# Patient Record
Sex: Male | Born: 1960 | Hispanic: Yes | Marital: Married | Smoking: Never smoker
Health system: Southern US, Community
[De-identification: ages and names within clinical notes are randomized; demographics above are authoritative.]

## PROBLEM LIST (undated history)

## (undated) DIAGNOSIS — E7800 Pure hypercholesterolemia, unspecified: Secondary | ICD-10-CM

## (undated) DIAGNOSIS — I10 Essential (primary) hypertension: Secondary | ICD-10-CM

## (undated) DIAGNOSIS — I499 Cardiac arrhythmia, unspecified: Secondary | ICD-10-CM

## (undated) DIAGNOSIS — R51 Headache: Secondary | ICD-10-CM

## (undated) DIAGNOSIS — R519 Headache, unspecified: Secondary | ICD-10-CM

---

## 2006-11-17 ENCOUNTER — Emergency Department: Payer: Self-pay | Admitting: Emergency Medicine

## 2006-11-26 ENCOUNTER — Emergency Department: Payer: Self-pay | Admitting: Emergency Medicine

## 2014-03-08 ENCOUNTER — Emergency Department: Payer: Self-pay | Admitting: Emergency Medicine

## 2014-03-08 LAB — CK TOTAL AND CKMB (NOT AT ARMC)
CK, TOTAL: 125 U/L
CK-MB: 0.8 ng/mL (ref 0.5–3.6)

## 2014-03-08 LAB — CBC
HCT: 43.1 % (ref 40.0–52.0)
HGB: 14.3 g/dL (ref 13.0–18.0)
MCH: 31.7 pg (ref 26.0–34.0)
MCHC: 33.2 g/dL (ref 32.0–36.0)
MCV: 95 fL (ref 80–100)
Platelet: 159 10*3/uL (ref 150–440)
RBC: 4.51 10*6/uL (ref 4.40–5.90)
RDW: 12.7 % (ref 11.5–14.5)
WBC: 6.8 10*3/uL (ref 3.8–10.6)

## 2014-03-08 LAB — TROPONIN I: Troponin-I: <0.02 ng/mL

## 2014-03-08 LAB — COMPREHENSIVE METABOLIC PANEL
ALK PHOS: 71 U/L
Albumin: 3.8 g/dL (ref 3.4–5.0)
Anion Gap: 10 (ref 7–16)
BUN: 12 mg/dL (ref 7–18)
Bilirubin,Total: 0.4 mg/dL (ref 0.2–1.0)
CALCIUM: 8 mg/dL — AB (ref 8.5–10.1)
CREATININE: 0.79 mg/dL (ref 0.60–1.30)
Chloride: 106 mmol/L (ref 98–107)
Co2: 25 mmol/L (ref 21–32)
EGFR (African American): >60
EGFR (Non-African Amer.): >60
GLUCOSE: 106 mg/dL — AB (ref 65–99)
OSMOLALITY: 281 (ref 275–301)
Potassium: 3.6 mmol/L (ref 3.5–5.1)
SGOT(AST): 24 U/L (ref 15–37)
SGPT (ALT): 24 U/L
Sodium: 141 mmol/L (ref 136–145)
Total Protein: 7.3 g/dL (ref 6.4–8.2)

## 2016-03-18 ENCOUNTER — Other Ambulatory Visit: Payer: Self-pay | Admitting: Pulmonary Disease

## 2016-03-18 DIAGNOSIS — M542 Cervicalgia: Secondary | ICD-10-CM

## 2016-03-18 DIAGNOSIS — G44329 Chronic post-traumatic headache, not intractable: Secondary | ICD-10-CM

## 2016-03-27 ENCOUNTER — Ambulatory Visit
Admission: RE | Admit: 2016-03-27 | Discharge: 2016-03-27 | Disposition: A | Discharge status: Home / Self Care | Payer: Worker's Compensation | Source: Ambulatory Visit | Attending: Pulmonary Disease | Admitting: Pulmonary Disease

## 2016-03-27 DIAGNOSIS — G44329 Chronic post-traumatic headache, not intractable: Secondary | ICD-10-CM

## 2016-03-27 DIAGNOSIS — M542 Cervicalgia: Secondary | ICD-10-CM | POA: Diagnosis not present

## 2016-03-27 DIAGNOSIS — G44309 Post-traumatic headache, unspecified, not intractable: Secondary | ICD-10-CM | POA: Insufficient documentation

## 2016-12-05 ENCOUNTER — Other Ambulatory Visit: Payer: Self-pay | Admitting: Neurosurgery

## 2016-12-20 NOTE — Pre-Procedure Instructions (Signed)
[facility] George  12/20/2016      [id number] [place], [place] - [address] [place] [address] Phone: [phone] Fax: [phone]    Your procedure is scheduled on Tuesday June 5.  Report to [facility] Admitting at 9:00 A.M.  Call this number if you have problems the morning of surgery:  [phone]   Remember:  Do not eat food or drink liquids after midnight.  Take these medicines the morning of surgery with A SIP OF WATER: NONE  7 days prior to surgery STOP taking any Aspirin, Aleve, Naproxen, Ibuprofen, Motrin, Advil, Goody's, BC's, all herbal medications, fish oil, and all vitamins    Do not wear jewelry  Do not wear lotions, powders, or perfumes, or deoderant.  Men may shave face and neck.  Do not bring valuables to the hospital.  [facility] is not responsible for any belongings or valuables.  Contacts, dentures or bridgework may not be worn into surgery.  Leave your suitcase in the car.  After surgery it may be brought to your room.  For patients admitted to the hospital, discharge time will be determined by your treatment team.  Patients discharged the day of surgery will not be allowed to drive home.    Special instructions:    [facility]- Preparing For Surgery  Before surgery, you can play an important role. Because skin is not sterile, your skin needs to be as free of germs as possible. You can reduce the number of germs on your skin by washing with CHG (chlorahexidine gluconate) Soap before surgery.  CHG is an antiseptic cleaner which kills germs and bonds with the skin to continue killing germs even after washing.  Please do not use if you have an allergy to CHG or antibacterial soaps. If your skin becomes reddened/irritated stop using the CHG.  Do not shave (including legs and underarms) for at least 48 hours prior to first CHG shower. It is OK to shave your face.  Please follow these instructions  carefully.   1. Shower the NIGHT BEFORE SURGERY and the MORNING OF SURGERY with CHG.   2. If you chose to wash your hair, wash your hair first as usual with your normal shampoo.  3. After you shampoo, rinse your hair and body thoroughly to remove the shampoo.  4. Use CHG as you would any other liquid soap. You can apply CHG directly to the skin and wash gently with a scrungie or a clean washcloth.   5. Apply the CHG Soap to your body ONLY FROM THE NECK DOWN.  Do not use on open wounds or open sores. Avoid contact with your eyes, ears, mouth and genitals (private parts). Wash genitals (private parts) with your normal soap.  6. Wash thoroughly, paying special attention to the area where your surgery will be performed.  7. Thoroughly rinse your body with warm water from the neck down.  8. DO NOT shower/wash with your normal soap after using and rinsing off the CHG Soap.  9. Pat yourself dry with a CLEAN TOWEL.   10. Wear CLEAN PAJAMAS   11. Place CLEAN SHEETS on your bed the night of your first shower and DO NOT SLEEP WITH PETS.    Day of Surgery: Do not apply any deodorants/lotions. Please wear clean clothes to the hospital/surgery center.  Instrucciones Para Antes de la Ciruga   Su ciruga est programada para-(your procedure is scheduled on) June 5   Entre [facility] Admitting - (enter) at 9:00    Por favor llame al [phone] si tiene algn problema en la maana de la ciruga. (please call if you have any problems the morning of surgery.)                  Recuerde: (Remember)   No coma alimentos ni tome lquidos, incluyendo agua, despus de la medianoche del June 4    Tome estas medicinas en la maana de la ciruga con un SORBITO de agua (take these meds the morning of surgery with a SIP of water) NONE   Puede cepillarse los dientes en la maana de la ciruga. (you may brush your teeth the morning of  surgery)   No use joyas, maquillaje de ojos, lpiz labial, crema para el cuerpo o esmalte de uas oscuro. (Do not wear jewelry, eye makeup, lipstick, body lotion, or dark fingernail polish)   No puede usar desodorante. (you may wear deodorant)   Si va a ser ingresado despues de la ciruga, deje la maleta en el carro hasta que se le haya asignado una habitacin. (If you are to be admitted after surgery, leave suitcase in car until your room has been assigned.)   A los pacientes que se les d de alta el mismo da no se les permitir manejar a casa.  (Patients discharged on the day of surgery will not be allowed to drive home)   Use ropa suelta y cmoda de regreso a casa. (wear loose comfortable clothes for ride home)

## 2016-12-23 ENCOUNTER — Encounter (HOSPITAL_COMMUNITY): Payer: Self-pay

## 2016-12-23 ENCOUNTER — Other Ambulatory Visit: Payer: Self-pay

## 2016-12-23 ENCOUNTER — Encounter (HOSPITAL_COMMUNITY)
Admission: RE | Admit: 2016-12-23 | Discharge: 2016-12-23 | Disposition: A | Discharge status: Home / Self Care | Payer: Self-pay | Source: Ambulatory Visit | Attending: Neurosurgery | Admitting: Neurosurgery

## 2016-12-23 DIAGNOSIS — R000 Tachycardia, unspecified: Secondary | ICD-10-CM | POA: Insufficient documentation

## 2016-12-23 DIAGNOSIS — M5020 Other cervical disc displacement, unspecified cervical region: Secondary | ICD-10-CM | POA: Insufficient documentation

## 2016-12-23 DIAGNOSIS — I10 Essential (primary) hypertension: Secondary | ICD-10-CM | POA: Insufficient documentation

## 2016-12-23 DIAGNOSIS — Z01810 Encounter for preprocedural cardiovascular examination: Secondary | ICD-10-CM | POA: Insufficient documentation

## 2016-12-23 DIAGNOSIS — Z01818 Encounter for other preprocedural examination: Secondary | ICD-10-CM | POA: Insufficient documentation

## 2016-12-23 HISTORY — DX: Cardiac arrhythmia, unspecified: I49.9

## 2016-12-23 HISTORY — DX: Headache: R51

## 2016-12-23 HISTORY — DX: Headache, unspecified: R51.9

## 2016-12-23 LAB — CBC WITH DIFFERENTIAL/PLATELET
Basophils Absolute: 0 10*3/uL (ref 0.0–0.1)
Basophils Relative: 0 %
EOS PCT: 2 %
Eosinophils Absolute: 0.1 10*3/uL (ref 0.0–0.7)
HCT: 42.9 % (ref 39.0–52.0)
Hemoglobin: 14.7 g/dL (ref 13.0–17.0)
LYMPHS ABS: 1.4 10*3/uL (ref 0.7–4.0)
LYMPHS PCT: 26 %
MCH: 32.1 pg (ref 26.0–34.0)
MCHC: 34.3 g/dL (ref 30.0–36.0)
MCV: 93.7 fL (ref 78.0–100.0)
MONOS PCT: 7 %
Monocytes Absolute: 0.4 10*3/uL (ref 0.1–1.0)
Neutro Abs: 3.5 10*3/uL (ref 1.7–7.7)
Neutrophils Relative %: 65 %
PLATELETS: 172 10*3/uL (ref 150–400)
RBC: 4.58 MIL/uL (ref 4.22–5.81)
RDW: 12.6 % (ref 11.5–15.5)
WBC: 5.3 10*3/uL (ref 4.0–10.5)

## 2016-12-23 LAB — SURGICAL PCR SCREEN
MRSA, PCR: NEGATIVE
Staphylococcus aureus: NEGATIVE

## 2016-12-23 LAB — BASIC METABOLIC PANEL
Anion gap: 9 (ref 5–15)
BUN: 13 mg/dL (ref 6–20)
CHLORIDE: 107 mmol/L (ref 101–111)
CO2: 23 mmol/L (ref 22–32)
CREATININE: 0.83 mg/dL (ref 0.61–1.24)
Calcium: 9.2 mg/dL (ref 8.9–10.3)
GFR calc Af Amer: >60 mL/min (ref >60)
GFR calc non Af Amer: >60 mL/min (ref >60)
GLUCOSE: 111 mg/dL — AB (ref 65–99)
POTASSIUM: 3.8 mmol/L (ref 3.5–5.1)
Sodium: 139 mmol/L (ref 135–145)

## 2016-12-23 MED ORDER — CHLORHEXIDINE GLUCONATE CLOTH 2 % EX PADS: 6.0000 | MEDICATED_PAD | Freq: Once | CUTANEOUS | Status: DC

## 2016-12-23 NOTE — Progress Notes (Signed)
PCP - [facility], pt unsure of a PCP name Cardiologist - none  Chest x-ray - N/A EKG - 12/23/2016, pt with hx of tachycardiac 8 years ago, no issues since, BP elevated at PAT Stress Test - 05/10/15 in careeverywhere   Patient denies shortness of breath, fever, cough and chest pain at PAT appointment   Patient verbalized understanding of instructions that were given to them at the PAT appointment. Patient was also instructed that they will need to review over the PAT instructions again at home before surgery.

## 2016-12-24 ENCOUNTER — Encounter (HOSPITAL_COMMUNITY): Payer: Self-pay | Admitting: General Practice

## 2016-12-24 ENCOUNTER — Inpatient Hospital Stay (HOSPITAL_COMMUNITY)
Admission: RE | Admit: 2016-12-24 | Discharge: 2016-12-25 | DRG: 473 | Disposition: A | Discharge status: Home / Self Care | Payer: Worker's Compensation | Source: Ambulatory Visit | Attending: Neurosurgery | Admitting: Neurosurgery

## 2016-12-24 ENCOUNTER — Inpatient Hospital Stay (HOSPITAL_COMMUNITY)
Admission: RE | Disposition: A | Discharge status: Home / Self Care | Payer: Self-pay | Source: Ambulatory Visit | Attending: Neurosurgery

## 2016-12-24 ENCOUNTER — Inpatient Hospital Stay (HOSPITAL_COMMUNITY): Payer: Worker's Compensation | Admitting: Emergency Medicine

## 2016-12-24 ENCOUNTER — Inpatient Hospital Stay (HOSPITAL_COMMUNITY): Payer: Worker's Compensation

## 2016-12-24 ENCOUNTER — Inpatient Hospital Stay (HOSPITAL_COMMUNITY): Payer: Worker's Compensation | Admitting: Anesthesiology

## 2016-12-24 DIAGNOSIS — M5001 Cervical disc disorder with myelopathy,  high cervical region: Principal | ICD-10-CM | POA: Diagnosis present

## 2016-12-24 DIAGNOSIS — Z79899 Other long term (current) drug therapy: Secondary | ICD-10-CM | POA: Diagnosis not present

## 2016-12-24 DIAGNOSIS — M4802 Spinal stenosis, cervical region: Secondary | ICD-10-CM | POA: Diagnosis present

## 2016-12-24 DIAGNOSIS — Z419 Encounter for procedure for purposes other than remedying health state, unspecified: Secondary | ICD-10-CM

## 2016-12-24 DIAGNOSIS — M542 Cervicalgia: Secondary | ICD-10-CM | POA: Diagnosis present

## 2016-12-24 HISTORY — PX: ANTERIOR CERVICAL DECOMP/DISCECTOMY FUSION: SHX1161

## 2016-12-24 SURGERY — ANTERIOR CERVICAL DECOMPRESSION/DISCECTOMY FUSION 3 LEVELS
Anesthesia: General

## 2016-12-24 MED ORDER — HYDROMORPHONE HCL 1 MG/ML IJ SOLN: 0.2500 mg | INTRAMUSCULAR | Status: DC | PRN

## 2016-12-24 MED ORDER — HYDROMORPHONE HCL 1 MG/ML IJ SOLN
0.2500 mg | INTRAMUSCULAR | Status: DC | PRN
  Administered 2016-12-24 (×2): 0.5 mg via INTRAVENOUS

## 2016-12-24 MED ORDER — BACITRACIN 50000 UNITS IM SOLR
INTRAMUSCULAR | Status: DC | PRN
  Administered 2016-12-24: 13:00:00

## 2016-12-24 MED ORDER — 0.9 % SODIUM CHLORIDE (POUR BTL) OPTIME
TOPICAL | Status: DC | PRN
  Administered 2016-12-24: 1000 mL

## 2016-12-24 MED ORDER — ROCURONIUM BROMIDE 100 MG/10ML IV SOLN
INTRAVENOUS | Status: DC | PRN
  Administered 2016-12-24: 60 mg via INTRAVENOUS
  Administered 2016-12-24: 10 mg via INTRAVENOUS

## 2016-12-24 MED ORDER — GELATIN ABSORBABLE MT POWD
OROMUCOSAL | Status: DC | PRN
  Administered 2016-12-24: 13:00:00 via TOPICAL

## 2016-12-24 MED ORDER — CYCLOBENZAPRINE HCL 10 MG PO TABS
10.0000 mg | ORAL_TABLET | Freq: Three times a day (TID) | ORAL | Status: DC | PRN
  Administered 2016-12-24 – 2016-12-25 (×2): 10 mg via ORAL
  Filled 2016-12-24: qty 1

## 2016-12-24 MED ORDER — ONDANSETRON HCL 4 MG PO TABS: 4.0000 mg | ORAL_TABLET | Freq: Four times a day (QID) | ORAL | Status: DC | PRN

## 2016-12-24 MED ORDER — FENTANYL CITRATE (PF) 250 MCG/5ML IJ SOLN
INTRAMUSCULAR | Status: AC
  Filled 2016-12-24: qty 5

## 2016-12-24 MED ORDER — SODIUM CHLORIDE 0.9% FLUSH: 3.0000 mL | Freq: Two times a day (BID) | INTRAVENOUS | Status: DC

## 2016-12-24 MED ORDER — PROPOFOL 10 MG/ML IV BOLUS
INTRAVENOUS | Status: DC | PRN
  Administered 2016-12-24: 140 mg via INTRAVENOUS

## 2016-12-24 MED ORDER — OXYCODONE HCL 5 MG/5ML PO SOLN: 5.0000 mg | Freq: Once | ORAL | Status: DC | PRN

## 2016-12-24 MED ORDER — ONDANSETRON HCL 4 MG/2ML IJ SOLN: 4.0000 mg | Freq: Four times a day (QID) | INTRAMUSCULAR | Status: DC | PRN

## 2016-12-24 MED ORDER — HYDROMORPHONE HCL 1 MG/ML IJ SOLN: 0.5000 mg | INTRAMUSCULAR | Status: DC | PRN

## 2016-12-24 MED ORDER — PROPOFOL 10 MG/ML IV BOLUS
INTRAVENOUS | Status: AC
  Filled 2016-12-24: qty 20

## 2016-12-24 MED ORDER — THROMBIN 5000 UNITS EX SOLR
CUTANEOUS | Status: AC
  Filled 2016-12-24: qty 5000

## 2016-12-24 MED ORDER — PHENOL 1.4 % MT LIQD: 1.0000 | OROMUCOSAL | Status: DC | PRN

## 2016-12-24 MED ORDER — FENTANYL CITRATE (PF) 100 MCG/2ML IJ SOLN
INTRAMUSCULAR | Status: DC | PRN
  Administered 2016-12-24: 100 ug via INTRAVENOUS
  Administered 2016-12-24 (×2): 50 ug via INTRAVENOUS
  Administered 2016-12-24: 25 ug via INTRAVENOUS
  Administered 2016-12-24: 100 ug via INTRAVENOUS

## 2016-12-24 MED ORDER — THROMBIN 20000 UNITS EX SOLR
CUTANEOUS | Status: AC
  Filled 2016-12-24: qty 20000

## 2016-12-24 MED ORDER — ENOXAPARIN SODIUM 40 MG/0.4ML ~~LOC~~ SOLN
40.0000 mg | Freq: Once | SUBCUTANEOUS | Status: AC
  Administered 2016-12-24: 40 mg via SUBCUTANEOUS
  Filled 2016-12-24 (×2): qty 0.4

## 2016-12-24 MED ORDER — MIDAZOLAM HCL 5 MG/5ML IJ SOLN
INTRAMUSCULAR | Status: DC | PRN
  Administered 2016-12-24: 2 mg via INTRAVENOUS

## 2016-12-24 MED ORDER — SUGAMMADEX SODIUM 200 MG/2ML IV SOLN
INTRAVENOUS | Status: AC
  Filled 2016-12-24: qty 2

## 2016-12-24 MED ORDER — GELATIN ABSORBABLE 100 EX MISC
CUTANEOUS | Status: DC | PRN
  Administered 2016-12-24: 13:00:00 via TOPICAL

## 2016-12-24 MED ORDER — MIDAZOLAM HCL 2 MG/2ML IJ SOLN: 0.5000 mg | Freq: Once | INTRAMUSCULAR | Status: DC | PRN

## 2016-12-24 MED ORDER — ONDANSETRON HCL 4 MG/2ML IJ SOLN
INTRAMUSCULAR | Status: AC
  Filled 2016-12-24: qty 2

## 2016-12-24 MED ORDER — SODIUM CHLORIDE 0.9% FLUSH: 3.0000 mL | INTRAVENOUS | Status: DC | PRN

## 2016-12-24 MED ORDER — HYDROCODONE-ACETAMINOPHEN 5-325 MG PO TABS
ORAL_TABLET | ORAL | Status: AC
  Administered 2016-12-24: 1 via ORAL
  Filled 2016-12-24: qty 1

## 2016-12-24 MED ORDER — PHENYLEPHRINE 40 MCG/ML (10ML) SYRINGE FOR IV PUSH (FOR BLOOD PRESSURE SUPPORT)
PREFILLED_SYRINGE | INTRAVENOUS | Status: AC
  Filled 2016-12-24: qty 10

## 2016-12-24 MED ORDER — CEFAZOLIN SODIUM-DEXTROSE 1-4 GM/50ML-% IV SOLN
1.0000 g | Freq: Three times a day (TID) | INTRAVENOUS | Status: AC
  Administered 2016-12-24 – 2016-12-25 (×2): 1 g via INTRAVENOUS
  Filled 2016-12-24: qty 50

## 2016-12-24 MED ORDER — LIDOCAINE HCL (CARDIAC) 20 MG/ML IV SOLN
INTRAVENOUS | Status: DC | PRN
  Administered 2016-12-24: 40 mg via INTRAVENOUS

## 2016-12-24 MED ORDER — MIDAZOLAM HCL 2 MG/2ML IJ SOLN
INTRAMUSCULAR | Status: AC
  Filled 2016-12-24: qty 2

## 2016-12-24 MED ORDER — HYDROMORPHONE HCL 1 MG/ML IJ SOLN
INTRAMUSCULAR | Status: AC
  Administered 2016-12-24: 0.5 mg via INTRAVENOUS
  Filled 2016-12-24: qty 1

## 2016-12-24 MED ORDER — OXYCODONE HCL 5 MG PO TABS: 5.0000 mg | ORAL_TABLET | Freq: Once | ORAL | Status: DC | PRN

## 2016-12-24 MED ORDER — PHENYLEPHRINE HCL 10 MG/ML IJ SOLN
INTRAMUSCULAR | Status: DC | PRN
  Administered 2016-12-24 (×3): 40 ug via INTRAVENOUS

## 2016-12-24 MED ORDER — SUGAMMADEX SODIUM 200 MG/2ML IV SOLN
INTRAVENOUS | Status: DC | PRN
  Administered 2016-12-24: 150 mg via INTRAVENOUS

## 2016-12-24 MED ORDER — MEPERIDINE HCL 25 MG/ML IJ SOLN: 6.2500 mg | INTRAMUSCULAR | Status: DC | PRN

## 2016-12-24 MED ORDER — PROMETHAZINE HCL 25 MG/ML IJ SOLN: 6.2500 mg | INTRAMUSCULAR | Status: DC | PRN

## 2016-12-24 MED ORDER — HYDROCODONE-ACETAMINOPHEN 5-325 MG PO TABS
1.0000 | ORAL_TABLET | ORAL | Status: DC | PRN
  Administered 2016-12-24: 1 via ORAL
  Administered 2016-12-24 – 2016-12-25 (×2): 2 via ORAL
  Filled 2016-12-24 (×2): qty 2

## 2016-12-24 MED ORDER — ARTIFICIAL TEARS OPHTHALMIC OINT
TOPICAL_OINTMENT | OPHTHALMIC | Status: DC | PRN
  Administered 2016-12-24: 1 via OPHTHALMIC

## 2016-12-24 MED ORDER — CYCLOBENZAPRINE HCL 10 MG PO TABS
ORAL_TABLET | ORAL | Status: AC
  Administered 2016-12-24: 10 mg via ORAL
  Filled 2016-12-24: qty 1

## 2016-12-24 MED ORDER — DEXAMETHASONE SODIUM PHOSPHATE 10 MG/ML IJ SOLN
10.0000 mg | INTRAMUSCULAR | Status: AC
  Administered 2016-12-24: 10 mg via INTRAVENOUS
  Filled 2016-12-24: qty 1

## 2016-12-24 MED ORDER — CEFAZOLIN SODIUM-DEXTROSE 2-4 GM/100ML-% IV SOLN
2.0000 g | INTRAVENOUS | Status: AC
  Administered 2016-12-24: 2 g via INTRAVENOUS
  Filled 2016-12-24: qty 100

## 2016-12-24 MED ORDER — EPHEDRINE 5 MG/ML INJ
INTRAVENOUS | Status: AC
  Filled 2016-12-24: qty 10

## 2016-12-24 MED ORDER — MENTHOL 3 MG MT LOZG: 1.0000 | LOZENGE | OROMUCOSAL | Status: DC | PRN

## 2016-12-24 MED ORDER — ONDANSETRON HCL 4 MG/2ML IJ SOLN
INTRAMUSCULAR | Status: DC | PRN
  Administered 2016-12-24: 4 mg via INTRAVENOUS

## 2016-12-24 MED ORDER — LACTATED RINGERS IV SOLN
INTRAVENOUS | Status: DC
  Administered 2016-12-24 (×2): via INTRAVENOUS

## 2016-12-24 MED ORDER — SODIUM CHLORIDE 0.9 % IV SOLN: 250.0000 mL | INTRAVENOUS | Status: DC

## 2016-12-24 SURGICAL SUPPLY — 56 items
BAG DECANTER FOR FLEXI CONT (MISCELLANEOUS) ×3 IMPLANT
BENZOIN TINCTURE PRP APPL 2/3 (GAUZE/BANDAGES/DRESSINGS) ×3 IMPLANT
BIT DRILL 13 (BIT) ×2 IMPLANT
BIT DRILL 13MM (BIT) ×1
BUR MATCHSTICK NEURO 3.0 LAGG (BURR) ×3 IMPLANT
CAGE PEEK 7X14X11 (Cage) ×4 IMPLANT
CANISTER SUCT 3000ML PPV (MISCELLANEOUS) ×3 IMPLANT
CARTRIDGE OIL MAESTRO DRILL (MISCELLANEOUS) ×1 IMPLANT
CLOSURE WOUND 1/2 X4 (GAUZE/BANDAGES/DRESSINGS) ×1
DIFFUSER DRILL AIR PNEUMATIC (MISCELLANEOUS) ×3 IMPLANT
DRAPE C-ARM 42X72 X-RAY (DRAPES) ×6 IMPLANT
DRAPE LAPAROTOMY 100X72 PEDS (DRAPES) ×3 IMPLANT
DRAPE MICROSCOPE LEICA (MISCELLANEOUS) ×3 IMPLANT
DRAPE POUCH INSTRU U-SHP 10X18 (DRAPES) ×3 IMPLANT
DURAPREP 6ML APPLICATOR 50/CS (WOUND CARE) ×3 IMPLANT
ELECT COATED BLADE 2.86 ST (ELECTRODE) ×3 IMPLANT
ELECT REM PT RETURN 9FT ADLT (ELECTROSURGICAL) ×3
ELECTRODE REM PT RTRN 9FT ADLT (ELECTROSURGICAL) ×1 IMPLANT
GAUZE SPONGE 4X4 12PLY STRL (GAUZE/BANDAGES/DRESSINGS) ×3 IMPLANT
GAUZE SPONGE 4X4 16PLY XRAY LF (GAUZE/BANDAGES/DRESSINGS) IMPLANT
GLOVE ECLIPSE 9.0 STRL (GLOVE) ×3 IMPLANT
GLOVE EXAM NITRILE LRG STRL (GLOVE) IMPLANT
GLOVE EXAM NITRILE XL STR (GLOVE) IMPLANT
GLOVE EXAM NITRILE XS STR PU (GLOVE) IMPLANT
GOWN STRL REUS W/ TWL LRG LVL3 (GOWN DISPOSABLE) IMPLANT
GOWN STRL REUS W/ TWL XL LVL3 (GOWN DISPOSABLE) IMPLANT
GOWN STRL REUS W/TWL 2XL LVL3 (GOWN DISPOSABLE) IMPLANT
GOWN STRL REUS W/TWL LRG LVL3 (GOWN DISPOSABLE)
GOWN STRL REUS W/TWL XL LVL3 (GOWN DISPOSABLE)
HALTER HD/CHIN CERV TRACTION D (MISCELLANEOUS) ×3 IMPLANT
HEMOSTAT POWDER KIT SURGIFOAM (HEMOSTASIS) ×3 IMPLANT
KIT BASIN OR (CUSTOM PROCEDURE TRAY) ×3 IMPLANT
KIT ROOM TURNOVER OR (KITS) ×3 IMPLANT
NEEDLE SPNL 20GX3.5 QUINCKE YW (NEEDLE) ×3 IMPLANT
NS IRRIG 1000ML POUR BTL (IV SOLUTION) ×3 IMPLANT
OIL CARTRIDGE MAESTRO DRILL (MISCELLANEOUS) ×3
PACK LAMINECTOMY NEURO (CUSTOM PROCEDURE TRAY) ×3 IMPLANT
PAD ARMBOARD 7.5X6 YLW CONV (MISCELLANEOUS) ×9 IMPLANT
PEEK CAGE 7X14X11 (Cage) ×3 IMPLANT
PLATE 3 57.5XLCK NS SPNE CVD (Plate) ×1 IMPLANT
PLATE 3 ATLANTIS TRANS (Plate) ×2 IMPLANT
RUBBERBAND STERILE (MISCELLANEOUS) ×6 IMPLANT
SCREW ST FIX 4 ATL 3120213 (Screw) ×24 IMPLANT
SPACER SPNL 11X14X7XPEEK CVD (Cage) ×2 IMPLANT
SPCR SPNL 11X14X7XPEEK CVD (Cage) ×2 IMPLANT
SPONGE INTESTINAL PEANUT (DISPOSABLE) ×3 IMPLANT
SPONGE SURGIFOAM ABS GEL 100 (HEMOSTASIS) ×3 IMPLANT
STRIP CLOSURE SKIN 1/2X4 (GAUZE/BANDAGES/DRESSINGS) ×2 IMPLANT
SUT VIC AB 3-0 SH 8-18 (SUTURE) ×3 IMPLANT
SUT VIC AB 4-0 RB1 18 (SUTURE) ×3 IMPLANT
TAPE CLOTH 4X10 WHT NS (GAUZE/BANDAGES/DRESSINGS) ×3 IMPLANT
TAPE CLOTH SURG 6X10 WHT LF (GAUZE/BANDAGES/DRESSINGS) ×3 IMPLANT
TOWEL GREEN STERILE (TOWEL DISPOSABLE) ×3 IMPLANT
TOWEL GREEN STERILE FF (TOWEL DISPOSABLE) ×3 IMPLANT
TRAP SPECIMEN MUCOUS 40CC (MISCELLANEOUS) ×3 IMPLANT
WATER STERILE IRR 1000ML POUR (IV SOLUTION) ×3 IMPLANT

## 2016-12-24 NOTE — Brief Op Note (Signed)
12/24/2016  1:47 PM  PATIENT:  [facility] George  56 y.o. male  PRE-OPERATIVE DIAGNOSIS:  herniated nucleus pulposus  POST-OPERATIVE DIAGNOSIS:  herniated nucleus pulposus  PROCEDURE:  Procedure(s): Anterior Cervical Decompression Fusion - Cervical three- Cervical four - Cervical four- Cervical five - Cervical five- Cervical six (N/A)  SURGEON:  Surgeon(s) and Role:    * Julio SicksPool, Jeriko Kowalke, MD - Primary    * Lisbeth RenshawNundkumar, Neelesh, MD - Assisting  PHYSICIAN ASSISTANT:   ASSISTANTS:    ANESTHESIA:   general  EBL:  Total I/O In: 1000 [I.V.:1000] Out: 75 [Blood:75]  BLOOD ADMINISTERED:none  DRAINS: none   LOCAL MEDICATIONS USED:  NONE  SPECIMEN:  No Specimen  DISPOSITION OF SPECIMEN:  N/A  COUNTS:  YES  TOURNIQUET:  * No tourniquets in log *  DICTATION: .Dragon Dictation  PLAN OF CARE: Admit to inpatient   PATIENT DISPOSITION:  PACU - hemodynamically stable.   Delay start of Pharmacological VTE agent (>24hrs) due to surgical blood loss or risk of bleeding: yes

## 2016-12-24 NOTE — Anesthesia Postprocedure Evaluation (Signed)
Anesthesia Post Note  Patient: Layman Hernandez-Quintana  Procedure(s) Performed: Procedure(s) (LRB): Anterior Cervical Decompression Fusion - Cervical three- Cervical four - Cervical four- Cervical five - Cervical five- Cervical six (N/A)     Patient location during evaluation: PACU Anesthesia Type: General Level of consciousness: sedated, patient cooperative and oriented Pain management: pain level controlled Vital Signs Assessment: post-procedure vital signs reviewed and stable Respiratory status: spontaneous breathing, nonlabored ventilation and respiratory function stable Cardiovascular status: blood pressure returned to baseline and stable Postop Assessment: no signs of nausea or vomiting Anesthetic complications: no    Last Vitals:  Vitals:   12/24/16 1525 12/24/16 1555  BP: 133/66 135/64  Pulse: 74 72  Resp: 11 16  Temp: 36.5 C 36.8 C    Last Pain:  Vitals:   12/24/16 1554  TempSrc:   PainSc: 0-No pain                 Yesli Vanderhoff,E. Kyzen Horn

## 2016-12-24 NOTE — Anesthesia Preprocedure Evaluation (Addendum)
Anesthesia Evaluation    History of Anesthesia Complications Negative for: history of anesthetic complications  Airway Mallampati: III  TM Distance: >3 FB Neck ROM: Full    Dental  (+) Teeth Intact   Pulmonary neg pulmonary ROS,    breath sounds clear to auscultation       Cardiovascular negative cardio ROS   Rhythm:Regular     Neuro/Psych  Headaches,  Neuromuscular disease    GI/Hepatic negative GI ROS, Neg liver ROS,   Endo/Other  negative endocrine ROS  Renal/GU negative Renal ROS     Musculoskeletal negative musculoskeletal ROS (+)   Abdominal   Peds  Hematology negative hematology ROS (+)   Anesthesia Other Findings   Reproductive/Obstetrics                            Anesthesia Physical Anesthesia Plan  ASA: I  Anesthesia Plan: General   Post-op Pain Management:    Induction: Intravenous  PONV Risk Score and Plan: 2 and Ondansetron, Dexamethasone and Treatment may vary due to age  Airway Management Planned: Oral ETT  Additional Equipment: None  Intra-op Plan:   Post-operative Plan: Extubation in OR  Informed Consent: I have reviewed the patients History and Physical, chart, labs and discussed the procedure including the risks, benefits and alternatives for the proposed anesthesia with the patient or authorized representative who has indicated his/her understanding and acceptance.   Dental advisory given  Plan Discussed with: CRNA and Surgeon  Anesthesia Plan Comments:         Anesthesia Quick Evaluation

## 2016-12-24 NOTE — Transfer of Care (Signed)
Immediate Anesthesia Transfer of Care Note  Patient: [facility]  Procedure(s) Performed: Procedure(s): Anterior Cervical Decompression Fusion - Cervical three- Cervical four - Cervical four- Cervical five - Cervical five- Cervical six (N/A)  Patient Location: PACU  Anesthesia Type:General  Level of Consciousness: awake and alert   Airway & Oxygen Therapy: Patient Spontanous Breathing and Patient connected to nasal cannula oxygen  Post-op Assessment: Report given to RN and Post -op Vital signs reviewed and stable  Post vital signs: Reviewed and stable  Last Vitals:  Vitals:   12/24/16 0850 12/24/16 1405  BP: (!) 165/79   Pulse: 61 74  Resp: 18 12  Temp: 36.7 C 36.6 C    Last Pain:  Vitals:   12/24/16 0924  TempSrc:   PainSc: 5       Patients Stated Pain Goal: 4 (12/24/16 0924)  Complications: No apparent anesthesia complications

## 2016-12-24 NOTE — H&P (Signed)
Philip George is an 56 y.o. male.   Chief Complaint: Neck pain HPI: 56 year old male with work-related injury with resultant neck pain, suboccipital pain and headaches, and radiating pain into the shoulders arms and upper extremities. Workup demonstrates evidence of a large central disc herniation at C3-4 with significant spondylosis and stenosis at C4-5 and C5-6. Patient presents now for three-level anterior cervical decompression and fusion in hopes of improving his symptoms.  Past Medical History:  Diagnosis Date  . Dysrhythmia    tachycardia 8 years ago, no problems since  . Headache     History reviewed. No pertinent surgical history.  History reviewed. No pertinent family history. Social History:  reports that he has never smoked. He has never used smokeless tobacco. He reports that he does not drink alcohol or use drugs.  Allergies:  Allergies  Allergen Reactions  . No Known Allergies     Medications Prior to Admission  Medication Sig Dispense Refill  . b complex vitamins tablet Take 1 tablet by mouth daily.    Marland Kitchen ibuprofen (ADVIL,MOTRIN) 200 MG tablet Take 600 mg by mouth every 8 (eight) hours as needed (for pain.).    Marland Kitchen naproxen (NAPROSYN) 500 MG tablet Take 500 mg by mouth 2 (two) times daily as needed (for pain.).    Marland Kitchen Omega-3 Fatty Acids (OMEGA-3 PO) Take 1 capsule by mouth daily.      Results for orders placed or performed during the hospital encounter of 12/23/16 (from the past 48 hour(s))  Surgical pcr screen     Status: None   Collection Time: 12/23/16  9:53 AM  Result Value Ref Range   MRSA, PCR NEGATIVE NEGATIVE   Staphylococcus aureus NEGATIVE NEGATIVE    Comment:        The Xpert SA Assay (FDA approved for NASAL specimens in patients over 2 years of age), is one component of a comprehensive surveillance program.  Test performance has been validated by [facility] for patients greater than or equal to 12 year old. It is not intended to  diagnose infection nor to guide or monitor treatment.   Basic metabolic panel     Status: Abnormal   Collection Time: 12/23/16  9:53 AM  Result Value Ref Range   Sodium 139 135 - 145 mmol/L   Potassium 3.8 3.5 - 5.1 mmol/L   Chloride 107 101 - 111 mmol/L   CO2 23 22 - 32 mmol/L   Glucose, Bld 111 (H) 65 - 99 mg/dL   BUN 13 6 - 20 mg/dL   Creatinine, Ser 0.83 0.61 - 1.24 mg/dL   Calcium 9.2 8.9 - 10.3 mg/dL   GFR calc non Af Amer >60 >60 mL/min   GFR calc Af Amer >60 >60 mL/min    Comment: (NOTE) The eGFR has been calculated using the CKD EPI equation. This calculation has not been validated in all clinical situations. eGFR's persistently <60 mL/min signify possible Chronic Kidney Disease.    Anion gap 9 5 - 15  CBC WITH DIFFERENTIAL     Status: None   Collection Time: 12/23/16  9:53 AM  Result Value Ref Range   WBC 5.3 4.0 - 10.5 K/uL   RBC 4.58 4.22 - 5.81 MIL/uL   Hemoglobin 14.7 13.0 - 17.0 g/dL   HCT 42.9 39.0 - 52.0 %   MCV 93.7 78.0 - 100.0 fL   MCH 32.1 26.0 - 34.0 pg   MCHC 34.3 30.0 - 36.0 g/dL   RDW 12.6 11.5 - 15.5 %  Platelets 172 150 - 400 K/uL   Neutrophils Relative % 65 %   Neutro Abs 3.5 1.7 - 7.7 K/uL   Lymphocytes Relative 26 %   Lymphs Abs 1.4 0.7 - 4.0 K/uL   Monocytes Relative 7 %   Monocytes Absolute 0.4 0.1 - 1.0 K/uL   Eosinophils Relative 2 %   Eosinophils Absolute 0.1 0.0 - 0.7 K/uL   Basophils Relative 0 %   Basophils Absolute 0.0 0.0 - 0.1 K/uL   No results found.  Pertinent items noted in HPI and remainder of comprehensive ROS otherwise negative.  Blood pressure (!) 165/79, pulse 61, temperature 98.1 F (36.7 C), temperature source Oral, resp. rate 18, height 5' 6.5" (1.689 m), weight 69 kg (152 lb 3.2 oz), SpO2 99 %.  Patient is awake and alert. He is oriented and appropriate. Cranial nerve function intact. Motor examination of his extremities normal. Sensory examination with patchy distal sensory loss in both lower extremities.  Reflexes are brisk. Mild Hoffmann's responses in both hands. Gait mildly spastic. Examination head ears eyes and throat center. Neck is reasonably supple. Airway is midline. Chest and abdomen exam normal. Assessment/Plan Cervical stenosis with early cervical myelopathy secondary to C3-4, C4-5 and C5-6 disc disease. Plan C3-4, C4-5, C5-6 anterior cervical discectomy with interbody fusion utilizing interbody peek cages and locally harvested autograft with anterior plate is mentation. Risks and benefits of been explained. Patient wishes to proceed.  Farooq Petrovich A 12/24/2016, 11:06 AM

## 2016-12-24 NOTE — Op Note (Signed)
Date of procedure: 12/24/2016  Date of dictation: Same  Service: Neurosurgery  Preoperative diagnosis: Cervical stenosis with myelopathy  Postoperative diagnosis: Same  Procedure Name: C3-4, C4-5, C5-6 anterior cervical discectomy with interbody fusion utilizing interbody peek cages, locally harvested autograft, and anterior plate instrumentation  Surgeon:Marck Mcclenny A.Lakoda Raske, M.D.  Asst. Surgeon: Conchita ParisNundkumar  Anesthesia: General  Indication: 56 year old male with work-related injury. Patient with persistent neck pain headaches and upper extremity symptoms. Workup demonstrates evidence of a moderately large central disc herniation at C3-4 with significant spinal cord compression. Patient with chronic spondylitic disease and stenosis at C4-5 and C5-6. Patient presents now for three-level anterior cervical decompression infusion in hopes of improving his symptoms.  Operative note: Anesthesia, patient position supine with neck slightly extended and held place of halter traction. Anterior cervical region prepped and draped sterilely. Incision made overlying C4-5. Dissection performed on the right. Retractor placed. X-ray taken. Levels confirmed. Discectomies performed at all 3 levels using various instruments down to level posterior annulus. Microscope brought field use at the remainder of discectomy. Remaining aspects of annulus and osteophytes removed using high-speed drill down to level posterior longitudinal ligament. Posterior lateral is an elevated and resected piecemeal fashion. Underlying thecal sac was then identified. Wide central decompression and perform undercutting the bodies of C3 and C4. Decompression then proceeded to each neural foramen. Wide anterior foraminotomies were performed on course exiting C4 nerve roots bilaterally. At this point a very thorough decompression achieved. There was no evidence of injury to thecal sac and nerve roots. Wound is an. With and like solution. Procedure then  repeated at C4-5 and C5-6 again without complications. [employer] anatomic peek cages packed with locally harvested autograft were then impacted into place at all 3 levels. Each cage was recessed slightly from the anterior cortical margin. Medtronic anterior Atlantis translational plate was then placed over the C3-C6 levels. This infection or fluoroscopic guidance using 13 mm fixed angle screws to reach it all 4 levels. All 8 screws given a final tightening found be solidly within the bone. Locking screws engaged. Final images revealed good position of the cages and the instrumentation at the proper upper level with normal alignment of the spine. Wounds and irrigated one final time. Hemostasis was assured with bipolar chart. Wounds and close in layers with Vicryl sutures. Steri-Strips and sterile dressing were applied. No apparent complications. Patient tolerated procedure well and he returns to the recovery room postop.

## 2016-12-24 NOTE — Progress Notes (Signed)
Orthopedic Tech Progress Note Patient Details:  [facility] 1961-01-06 [id number]  Ortho Devices Type of Ortho Device: Soft collar Ortho Device/Splint Location: neck Ortho Device/Splint Interventions: Ordered, Application   Jennye MoccasinHughes, [name] Craig 12/24/2016, 3:15 PM

## 2016-12-24 NOTE — Anesthesia Procedure Notes (Signed)
Procedure Name: Intubation Date/Time: 12/24/2016 11:58 AM Performed by: Edmonia CaprioAUSTON, Nalla Purdy M Pre-anesthesia Checklist: Patient identified, Suction available, Emergency Drugs available, Patient being monitored and Timeout performed Patient Re-evaluated:Patient Re-evaluated prior to inductionOxygen Delivery Method: Circle system utilized Preoxygenation: Pre-oxygenation with 100% oxygen Intubation Type: IV induction Ventilation: Mask ventilation without difficulty Laryngoscope Size: Glidescope and 3 Grade View: Grade I Tube type: Oral Tube size: 7.0 mm Number of attempts: 2 Airway Equipment and Method: Video-laryngoscopy Placement Confirmation: ETT inserted through vocal cords under direct vision,  CO2 detector and breath sounds checked- equal and bilateral Secured at: 21 cm Tube secured with: Tape Dental Injury: Teeth and Oropharynx as per pre-operative assessment  Comments: Elective glidescope intubation.  Gr 1 view.

## 2016-12-25 ENCOUNTER — Encounter (HOSPITAL_COMMUNITY): Payer: Self-pay | Admitting: Neurosurgery

## 2016-12-25 MED ORDER — CYCLOBENZAPRINE HCL 10 MG PO TABS: 10.0000 mg | ORAL_TABLET | Freq: Three times a day (TID) | ORAL | 0 refills | Status: AC | PRN

## 2016-12-25 MED ORDER — HYDROCODONE-ACETAMINOPHEN 5-325 MG PO TABS: 1.0000 | ORAL_TABLET | ORAL | 0 refills | Status: DC | PRN

## 2016-12-25 MED FILL — Thrombin For Soln 20000 Unit: CUTANEOUS | Qty: 1 | Status: AC

## 2016-12-25 NOTE — Progress Notes (Signed)
Patient is discharged from room 3C07 at this time. Alert and in stable condition. IV site d/c'd and instructions read to patient and wife with understanding verbalized. Left unit via wheelchair with all belongings at side. 

## 2016-12-25 NOTE — Discharge Instructions (Signed)

## 2016-12-25 NOTE — Discharge Summary (Signed)
Physician Discharge Summary  Patient ID: Philip George MRN: [id number] DOB/AGE: February 05, 1961 56 y.o.  Admit date: 12/24/2016 Discharge date: 12/25/2016  Admission Diagnoses:  Discharge Diagnoses:  Active Problems:   Cervical stenosis of spinal canal   Discharged Condition: good  Hospital Course: Patient admitted to the hospital where he underwent a complete 3 level anterior cervical decompression and fusion. Postoperatively did well. Preoperative neck and upper extremity pain improved. Ambulated without difficulty.  For discharge home    Consults:   Significant Diagnostic Studies:   Treatments:   Discharge Exam: Blood pressure 120/60, pulse 67, temperature 98.8 F (37.1 C), resp. rate 16, height 5' 6.5" (1.689 m), weight 69 kg (152 lb 3.2 oz), SpO2 100 %. Awake and alert. Oriented and appropriate.  Nerve function intact. Motor and sensory function extremities normal. Wound clean and dry. Chest abdomen benign.  Disposition: Final discharge disposition not confirmed   Allergies as of 12/25/2016      Reactions   No Known Allergies       Medication List    TAKE these medications   b complex vitamins tablet Take 1 tablet by mouth daily.   cyclobenzaprine 10 MG tablet Commonly known as:  FLEXERIL Take 1 tablet (10 mg total) by mouth 3 (three) times daily as needed for muscle spasms.   HYDROcodone-acetaminophen 5-325 MG tablet Commonly known as:  NORCO/VICODIN Take 1-2 tablets by mouth every 4 (four) hours as needed (breakthrough pain).   ibuprofen 200 MG tablet Commonly known as:  ADVIL,MOTRIN Take 600 mg by mouth every 8 (eight) hours as needed (for pain.).   naproxen 500 MG tablet Commonly known as:  NAPROSYN Take 500 mg by mouth 2 (two) times daily as needed (for pain.).   OMEGA-3 PO Take 1 capsule by mouth daily.        Signed: Erica Richwine A 12/25/2016, 9:44 AM

## 2019-10-11 ENCOUNTER — Ambulatory Visit: Payer: Self-pay | Attending: Internal Medicine

## 2019-10-11 DIAGNOSIS — Z23 Encounter for immunization: Secondary | ICD-10-CM

## 2019-10-11 NOTE — Progress Notes (Signed)
   Covid-19 Vaccination Clinic  Name:  Philip George    MRN: [id number] DOB: 1960-08-25  10/11/2019  Philip George was observed post Covid-19 immunization for 15 minutes without incident. He was provided with Vaccine Information Sheet and instruction to access the V-Safe system.   [facility] was instructed to call 911 with any severe reactions post vaccine: Marland Kitchen Difficulty breathing  . Swelling of face and throat  . A fast heartbeat  . A bad rash all over body  . Dizziness and weakness   Immunizations Administered    Name Date Dose VIS Date Route   Pfizer COVID-19 Vaccine 10/11/2019  4:40 PM 0.3 mL 07/02/2019 Intramuscular   Manufacturer: [employer], [employer]   Lot: [id number]   NDC: [address]-5

## 2019-10-16 ENCOUNTER — Emergency Department: Payer: Worker's Compensation

## 2019-10-16 ENCOUNTER — Other Ambulatory Visit: Payer: Self-pay

## 2019-10-16 ENCOUNTER — Emergency Department
Admission: EM | Admit: 2019-10-16 | Discharge: 2019-10-16 | Disposition: A | Discharge status: Home / Self Care | Payer: Worker's Compensation | Attending: Emergency Medicine | Admitting: Emergency Medicine

## 2019-10-16 DIAGNOSIS — Y9289 Other specified places as the place of occurrence of the external cause: Secondary | ICD-10-CM | POA: Diagnosis not present

## 2019-10-16 DIAGNOSIS — Y990 Civilian activity done for income or pay: Secondary | ICD-10-CM | POA: Diagnosis not present

## 2019-10-16 DIAGNOSIS — Z79899 Other long term (current) drug therapy: Secondary | ICD-10-CM | POA: Insufficient documentation

## 2019-10-16 DIAGNOSIS — W11XXXA Fall on and from ladder, initial encounter: Secondary | ICD-10-CM | POA: Insufficient documentation

## 2019-10-16 DIAGNOSIS — Y9389 Activity, other specified: Secondary | ICD-10-CM | POA: Diagnosis not present

## 2019-10-16 DIAGNOSIS — M79671 Pain in right foot: Secondary | ICD-10-CM | POA: Diagnosis not present

## 2019-10-16 DIAGNOSIS — M79672 Pain in left foot: Secondary | ICD-10-CM | POA: Insufficient documentation

## 2019-10-16 DIAGNOSIS — S3992XA Unspecified injury of lower back, initial encounter: Secondary | ICD-10-CM | POA: Diagnosis present

## 2019-10-16 DIAGNOSIS — S32010A Wedge compression fracture of first lumbar vertebra, initial encounter for closed fracture: Secondary | ICD-10-CM | POA: Diagnosis not present

## 2019-10-16 MED ORDER — KETOROLAC TROMETHAMINE 60 MG/2ML IM SOLN
60.0000 mg | Freq: Once | INTRAMUSCULAR | Status: AC
Start: 2019-10-16 — End: 2019-10-16
  Administered 2019-10-16: 11:00:00 60 mg via INTRAMUSCULAR
  Filled 2019-10-16: qty 2

## 2019-10-16 MED ORDER — HYDROMORPHONE HCL 1 MG/ML IJ SOLN
1.0000 mg | Freq: Once | INTRAMUSCULAR | Status: AC
  Administered 2019-10-16: 11:00:00 1 mg via INTRAMUSCULAR
  Filled 2019-10-16: qty 1

## 2019-10-16 MED ORDER — IBUPROFEN 600 MG PO TABS: 600.0000 mg | ORAL_TABLET | Freq: Three times a day (TID) | ORAL | 0 refills | Status: DC | PRN

## 2019-10-16 MED ORDER — OXYCODONE-ACETAMINOPHEN 7.5-325 MG PO TABS: 1.0000 | ORAL_TABLET | Freq: Four times a day (QID) | ORAL | 0 refills | Status: DC | PRN

## 2019-10-16 NOTE — ED Notes (Signed)
ED Provider at bedside. 

## 2019-10-16 NOTE — ED Notes (Signed)
Spoke with Steward Ros from [employer], pt will need UDS.

## 2019-10-16 NOTE — ED Provider Notes (Signed)
[facility] Emergency Department Provider Note   ____________________________________________   First MD Initiated Contact with Patient 10/16/19 [phone]     (approximate)  I have reviewed the triage vital signs and the nursing notes.   HISTORY  Chief Complaint Foot Pain and Back Pain    HPI Philip George is a 59 y.o. male patient complain of bilateral foot and back pain secondary to fall from a ladder at work yesterday.  Patient said he fell approximately 10 feet.  Patient he landed on his feet.  Patient denies radicular component to his back pain.  Patient denies bladder or bowel dysfunction.  Patient states the right foot hurts worse than the left.  Patient is able to bear weight.  Patient have a history of spinal surgery but denies cervical pain at this time.  Patient rates his back and foot pain as 8/10.  Patient described pain as "achy".  Patient state mild relief with over-the-counter anti-inflammatory medications.         Past Medical History:  Diagnosis Date  . Dysrhythmia    tachycardia 8 years ago, no problems since  . Headache     Patient Active Problem List   Diagnosis Date Noted  . Cervical stenosis of spinal canal 12/24/2016    Past Surgical History:  Procedure Laterality Date  . ANTERIOR CERVICAL DECOMP/DISCECTOMY FUSION N/A 12/24/2016   Procedure: Anterior Cervical Decompression Fusion - Cervical three- Cervical four - Cervical four- Cervical five - Cervical five- Cervical six;  Surgeon: Julio Sicks, MD;  Location: [facility] OR;  Service: Neurosurgery;  Laterality: N/A;    Prior to Admission medications   Medication Sig Start Date End Date Taking? Authorizing Provider  b complex vitamins tablet Take 1 tablet by mouth daily.    [provider]  cyclobenzaprine (FLEXERIL) 10 MG tablet Take 1 tablet (10 mg total) by mouth 3 (three) times daily as needed for muscle spasms. 12/25/16   Julio Sicks, MD    HYDROcodone-acetaminophen (NORCO/VICODIN) 5-325 MG tablet Take 1-2 tablets by mouth every 4 (four) hours as needed (breakthrough pain). 12/25/16   Julio Sicks, MD  ibuprofen (ADVIL) 600 MG tablet Take 1 tablet (600 mg total) by mouth every 8 (eight) hours as needed. 10/16/19   Joni Reining, PA-C  ibuprofen (ADVIL,MOTRIN) 200 MG tablet Take 600 mg by mouth every 8 (eight) hours as needed (for pain.).    [provider]  naproxen (NAPROSYN) 500 MG tablet Take 500 mg by mouth 2 (two) times daily as needed (for pain.).    [provider]  Omega-3 Fatty Acids (OMEGA-3 PO) Take 1 capsule by mouth daily.    [provider]  oxyCODONE-acetaminophen (PERCOCET) 7.5-325 MG tablet Take 1 tablet by mouth every 6 (six) hours as needed. 10/16/19   Joni Reining, PA-C    Allergies No known allergies  History reviewed. No pertinent family history.  Social History Social History   Tobacco Use  . Smoking status: Never Smoker  . Smokeless tobacco: Never Used  Substance Use Topics  . Alcohol use: No  . Drug use: No    Review of Systems Constitutional: No fever/chills Eyes: No visual changes. ENT: No sore throat. Cardiovascular: Denies chest pain. Respiratory: Denies shortness of breath. Gastrointestinal: No abdominal pain.  No nausea, no vomiting.  No diarrhea.  No constipation. Genitourinary: Negative for dysuria. Musculoskeletal: Positive for back pain bilateral foot pain. Skin: Negative for rash. Neurological: Negative for headaches, focal weakness or numbness.   ____________________________________________  PHYSICAL EXAM:  VITAL SIGNS: ED Triage Vitals 10/16/19 0940  Enc Vitals Group     BP (!) 160/72     Pulse Rate 79     Resp 16     Temp 98.6 F (37 C)     Temp Source Oral     SpO2 99 %     Weight 157 lb (71.2 kg)     Height 5\' 9"  (1.753 m)     Head Circumference      Peak Flow      Pain Score 8     Pain Loc      Pain Edu?      Excl. in [place]?      Constitutional: Alert and oriented.  Moderate distress.   Eyes: Conjunctivae are normal. PERRL. EOMI. Head: Atraumatic. Nose: No congestion/rhinnorhea. Mouth/Throat: Mucous membranes are moist.  Oropharynx non-erythematous. Neck: No stridor.  No cervical spine tenderness to palpation. Hematological/Lymphatic/Immunilogical: No cervical lymphadenopathy. Cardiovascular: Normal rate, regular rhythm. Grossly normal heart sounds.  Good peripheral circulation.  Elevated blood pressure Respiratory: Normal respiratory effort.  No retractions. Lungs CTAB. Gastrointestinal: Soft and nontender. No distention. No abdominal bruits. No CVA tenderness. Musculoskeletal: No obvious lumbar spine deformity.  Patient is moderate guarding palpation L1-L3.  No lower extremity tenderness nor edema.  No joint effusions. Neurologic:  Normal speech and language. No gross focal neurologic deficits are appreciated. No gait instability. Skin:  Skin is warm, dry and intact. No rash noted. Psychiatric: Mood and affect are normal. Speech and behavior are normal.  ____________________________________________   LABS (all labs ordered are listed, but only abnormal results are displayed)  Labs Reviewed - No data to display ____________________________________________  EKG   ____________________________________________  RADIOLOGY  ED MD interpretation:    Official radiology report(s): DG Lumbar Spine Complete  Result Date: 10/16/2019 CLINICAL DATA:  Pain post fall off ladder EXAM: LUMBAR SPINE - COMPLETE 4+ VIEW COMPARISON:  None. FINDINGS: Subtle cortical irregularity at the anterior aspect of T12 vertebral body with mild loss of height anteriorly suggesting mild compression deformity, age indeterminate. Cortical irregularity along the anterior margin of the L1 vertebral body with linear sclerosis across the body below the superior endplate suggesting mild compression deformity, age indeterminate. No evident  bony retropulsion. No evidence of posterior element involvement. Alignment is preserved. Disc heights maintained throughout. IMPRESSION: Mild T12 and L1 compression deformities, age indeterminate. Electronically Signed   By: Lucrezia Europe M.D.   On: 10/16/2019 11:12   CT Lumbar Spine Wo Contrast  Result Date: 10/16/2019 CLINICAL DATA:  Fall from ladder.  Lumbar fracture. EXAM: CT LUMBAR SPINE WITHOUT CONTRAST TECHNIQUE: Multidetector CT imaging of the lumbar spine was performed without intravenous contrast administration. Multiplanar CT image reconstructions were also generated. COMPARISON:  Lumbar radiographs 10/16/2019 FINDINGS: Segmentation: Normal Alignment: Normal Vertebrae: Mild loss of height of T11 and T12. No definite acute cortical fracture. Possible chronic fractures versus acute nondisplaced fractures Superior endplate fracture of L1 is acute with fracture through the superior endplate. Mild loss of height. No extension into the posterior elements. No retropulsion of bone into the canal. No other fractures. Paraspinal and other soft tissues: No paraspinous mass or edema. Disc levels: Disc spaces well maintained. No disc protrusion or spinal stenosis. IMPRESSION: Mild acute fracture superior endplate of L1 without spinal stenosis Mild loss of height of T11 and T12, favor chronic over acute fractures. Electronically Signed   By: Franchot Gallo M.D.   On: 10/16/2019 12:07   DG Foot Complete  Left  Result Date: 10/16/2019 CLINICAL DATA:  Pain post fall off ladder EXAM: LEFT FOOT - COMPLETE 3+ VIEW COMPARISON:  None available FINDINGS: There is no evidence of fracture or dislocation. There is no evidence of arthropathy or other focal bone abnormality. Soft tissues are unremarkable. IMPRESSION: Negative. Electronically Signed   By: Corlis Leak M.D.   On: 10/16/2019 11:16   DG Foot Complete Right  Result Date: 10/16/2019 CLINICAL DATA:  Pain post fall from ladder EXAM: RIGHT FOOT COMPLETE - 3+ VIEW  COMPARISON:  None. FINDINGS: There is no evidence of fracture or dislocation. There is no evidence of arthropathy or other focal bone abnormality. Soft tissues are unremarkable. IMPRESSION: Negative. Electronically Signed   By: Corlis Leak M.D.   On: 10/16/2019 11:17    ____________________________________________   PROCEDURES  Procedure(s) performed (including Critical Care):  Procedures   ____________________________________________   INITIAL IMPRESSION / ASSESSMENT AND PLAN / ED COURSE  As part of my medical decision making, I reviewed the following data within the electronic MEDICAL RECORD NUMBER     Patient presents with back and bilateral foot pain secondary to fall from a ladder.  Differential consist lumbar and feet fracture, back strain, and bilateral foot contusion.  Patient x-ray and CT consistent with compression fracture L1.  Patient given discharge care instructions and follow-up orthopedic for definitive evaluation and treatment.    Goldman Birchall was evaluated in Emergency Department on 10/16/2019 for the symptoms described in the history of present illness. He was evaluated in the context of the global COVID-19 pandemic, which necessitated consideration that the patient might be at risk for infection with the SARS-CoV-2 virus that causes COVID-19. Institutional protocols and algorithms that pertain to the evaluation of patients at risk for COVID-19 are in a state of rapid change based on information released by regulatory bodies including the CDC and federal and state organizations. These policies and algorithms were followed during the patient's care in the ED.       ____________________________________________   FINAL CLINICAL IMPRESSION(S) / ED DIAGNOSES  Final diagnoses:  Compression fracture of L1 vertebra, initial encounter [facility])     ED Discharge Orders         Ordered    oxyCODONE-acetaminophen (PERCOCET) 7.5-325 MG tablet  Every 6 hours PRN      10/16/19 1240    ibuprofen (ADVIL) 600 MG tablet  Every 8 hours PRN     10/16/19 1240           Note:  This document was prepared using Dragon voice recognition software and may include unintentional dictation errors.    Joni Reining, PA-C 10/16/19 1242    Jene Every, MD 10/16/19 [phone]

## 2019-10-16 NOTE — ED Notes (Signed)
Pt c/o bilateral foot pain and back pain. Pt ambulatory to room from waiting room without difficulty. Pt states injury happened yesterday.  EDT Genelle Bal notified patient needs UDS per employer
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
## 2019-10-16 NOTE — Discharge Instructions (Signed)
Follow discharge care instruction take medication as directed.  Call orthopedic clinic Monday morning after a 30 to schedule appointment for definitive evaluation and treatment.

## 2019-11-01 ENCOUNTER — Other Ambulatory Visit: Payer: Self-pay

## 2019-11-01 ENCOUNTER — Ambulatory Visit: Payer: Self-pay | Attending: Internal Medicine

## 2019-11-01 DIAGNOSIS — Z23 Encounter for immunization: Secondary | ICD-10-CM

## 2019-11-01 NOTE — Progress Notes (Signed)
   Covid-19 Vaccination Clinic  Name:  Math Brazie    MRN: [id number] DOB: [birth year]  11/01/2019  Mr. Hernandez-Quintana was observed post Covid-19 immunization for 15 minutes without incident. He was provided with Vaccine Information Sheet and instruction to access the V-Safe system. Medical Interpreter used.  Mr. Toral was instructed to call 911 with any severe reactions post vaccine: Marland Kitchen Difficulty breathing  . Swelling of face and throat  . A fast heartbeat  . A bad rash all over body  . Dizziness and weakness   Immunizations Administered    Name Date Dose VIS Date Route   Pfizer COVID-19 Vaccine 11/01/2019  6:40 PM 0.3 mL 07/02/2019 Intramuscular   Manufacturer: [employer], [employer]   Lot: [id number]   NDC: [address]-6

## 2021-03-12 IMAGING — CT CT L SPINE W/O CM
3 series · 12 of 33 positions shown, 14 images · non-contrast
Comparison: Lumbar radiographs 10/16/2019

CLINICAL DATA: Fall from ladder.  Lumbar fracture.

EXAM:
CT LUMBAR SPINE WITHOUT CONTRAST
TECHNIQUE: Multidetector CT imaging of the lumbar spine was performed without
intravenous contrast administration. Multiplanar CT image
reconstructions were also generated.

[Series 4: l spine soft · axial · 0.37mm/px · z∈[-268,-88]mm · 4 of 131 slices shown, 5 images]
[im 21/131  soft-tissue]
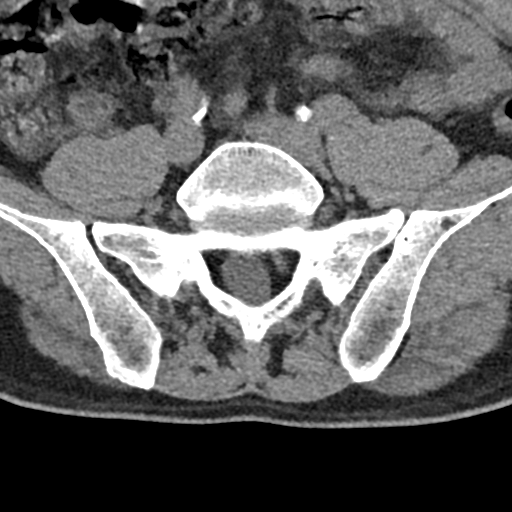
[im 21/131  bone]
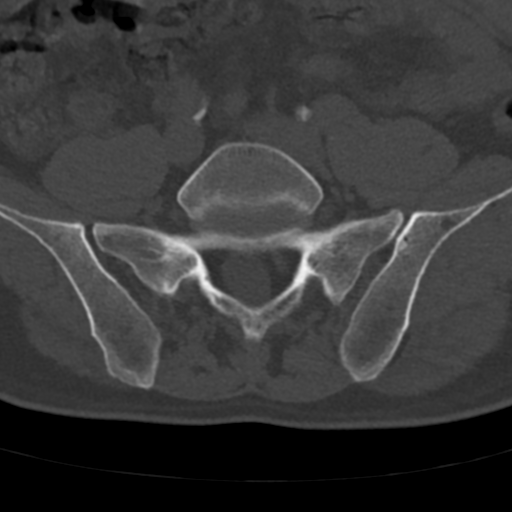
[im 51/131  bone]
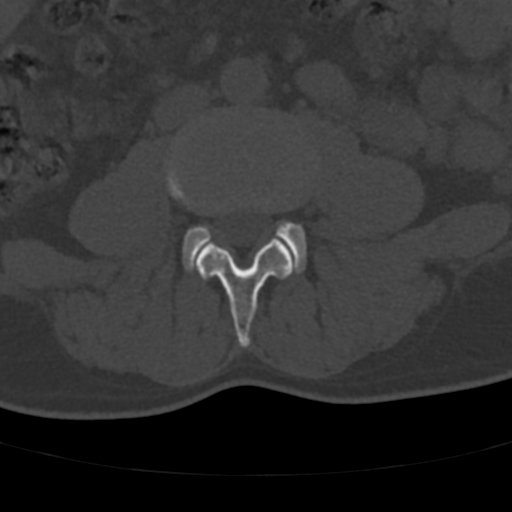
[im 81/131  bone]
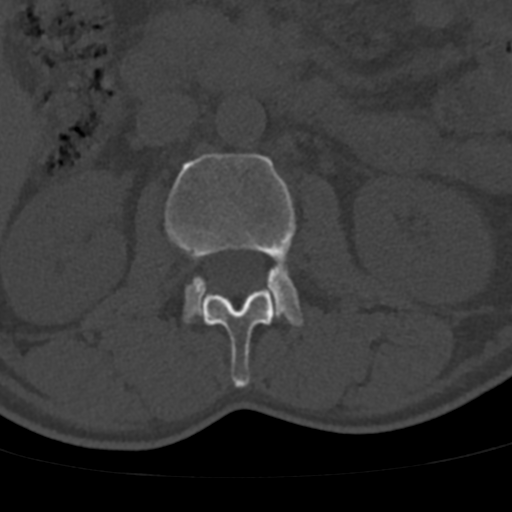
[im 111/131  bone]
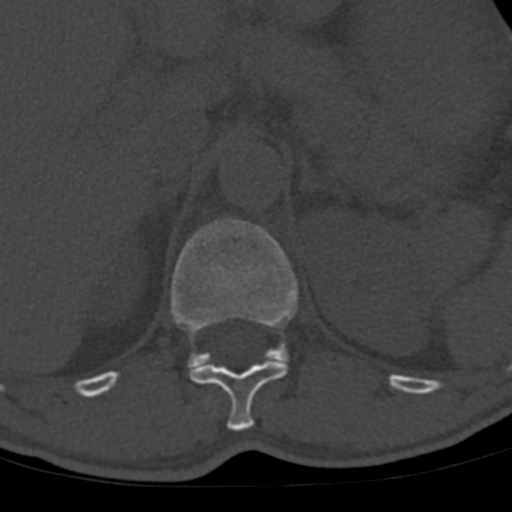

[Series 5: sagittal bone · sagittal · 0.30mm/px · 5 of 101 slices shown, 6 images]
[im 34/101  bone]
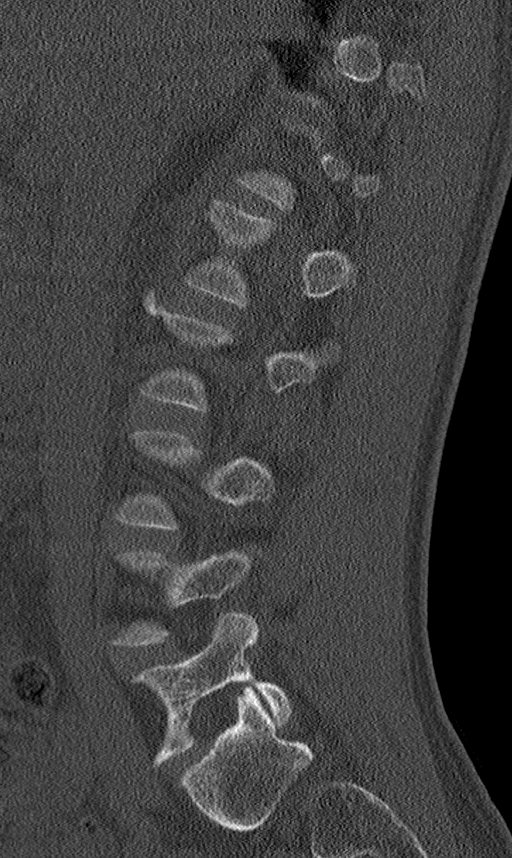
[im 42/101  bone]
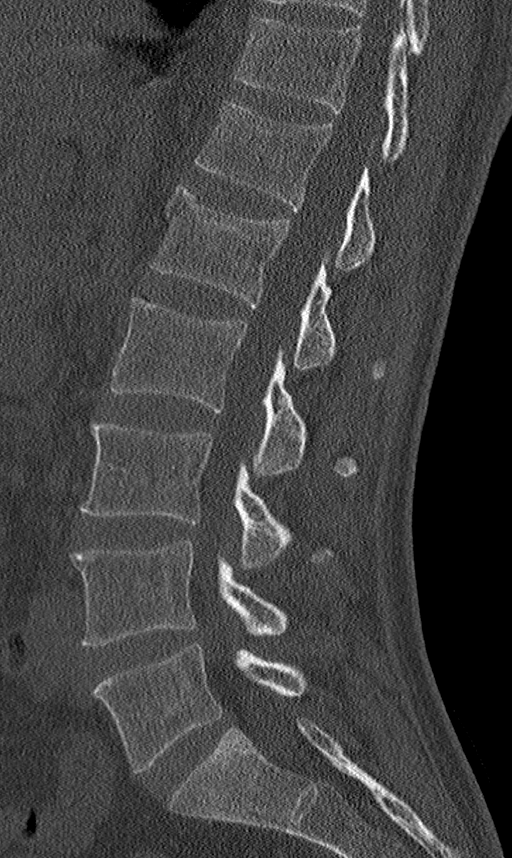
[im 51/101  soft-tissue]
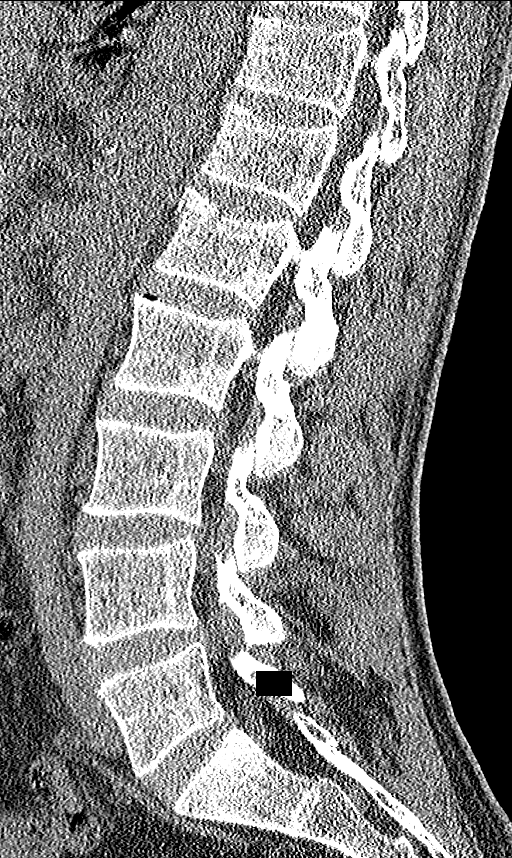
[im 51/101  bone]
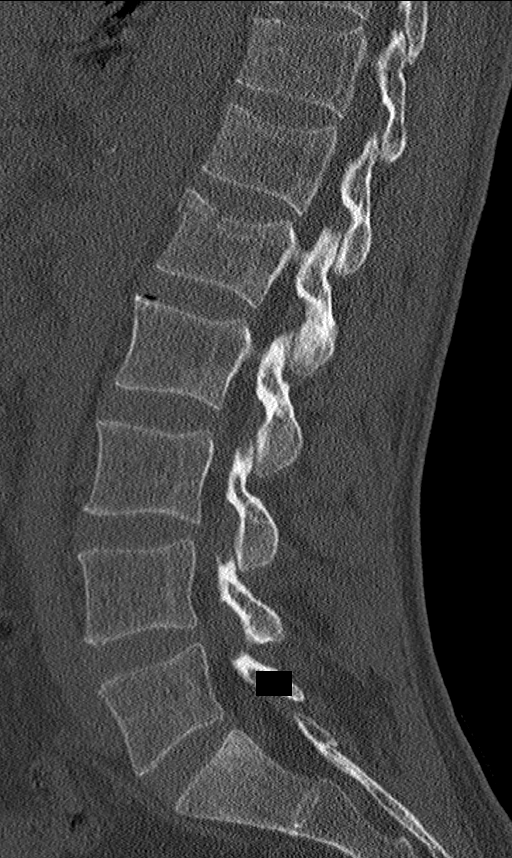
[im 59/101  bone]
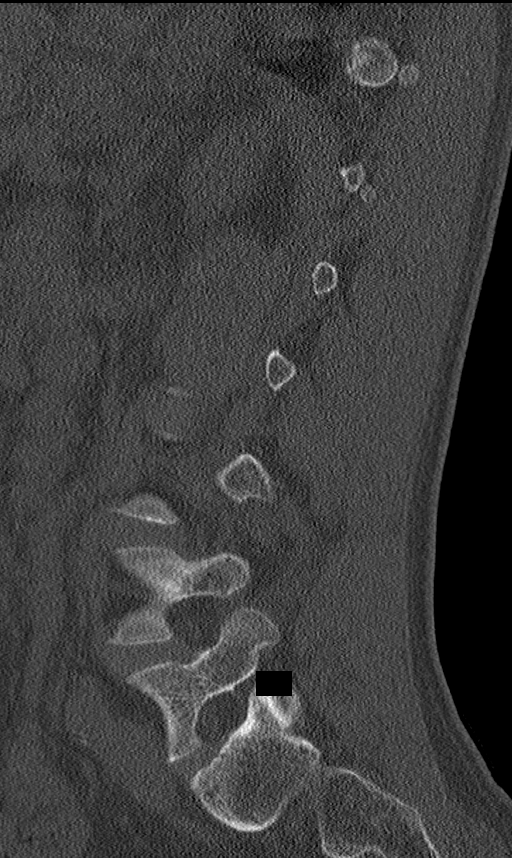
[im 67/101  bone]
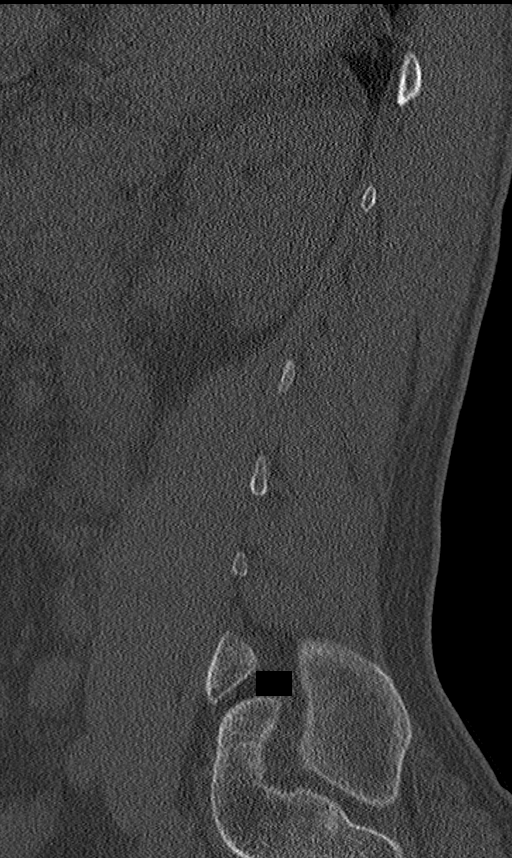

[Series 6: coronal bone · coronal · 0.39mm/px · 3 of 77 slices shown]
[im 16/77  bone]
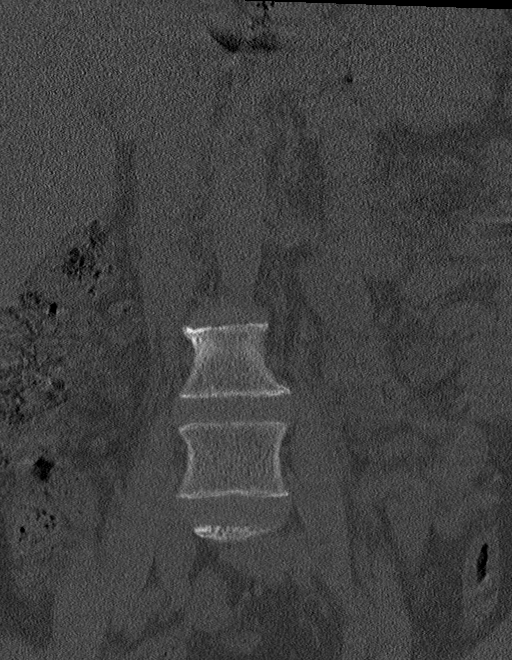
[im 31/77  bone]
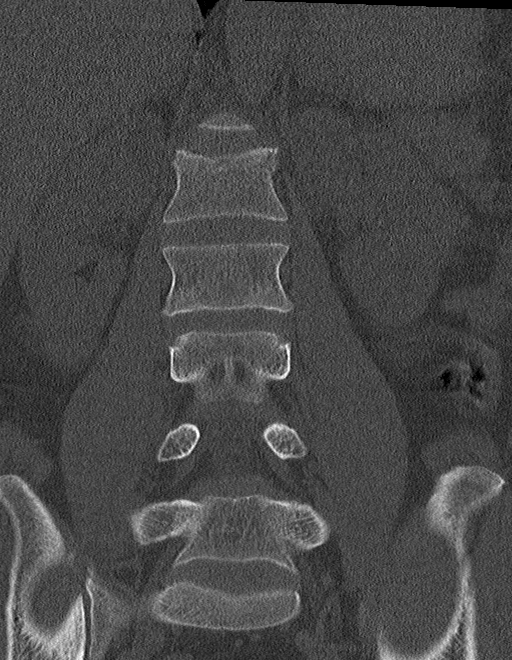
[im 46/77  bone]
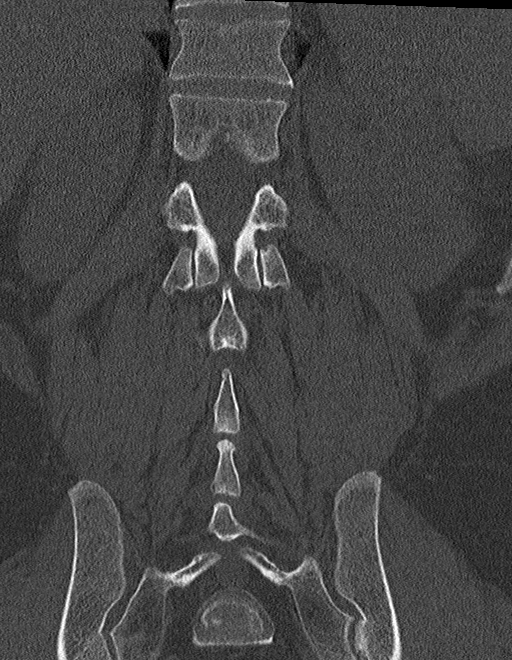

[12 of 33 positions shown; findings below may reference images not displayed]

FINDINGS: Segmentation: Normal

Alignment: Normal

Vertebrae: Mild loss of height of T11 and T12. No definite acute
cortical fracture. Possible chronic fractures versus acute
nondisplaced fractures

Superior endplate fracture of L1 is acute with fracture through the
superior endplate. Mild loss of height. No extension into the
posterior elements. No retropulsion of bone into the canal.

No other fractures.

Paraspinal and other soft tissues: No paraspinous mass or edema.

Disc levels: Disc spaces well maintained. No disc protrusion or
spinal stenosis.
IMPRESSION: Mild acute fracture superior endplate of L1 without spinal stenosis

Mild loss of height of T11 and T12, favor chronic over acute
fractures.

## 2021-03-12 IMAGING — CR DG LUMBAR SPINE COMPLETE 4+V
5 series · 5 of 5 positions shown · non-contrast
Comparison: None.

CLINICAL DATA: Pain post fall off ladder

EXAM:
LUMBAR SPINE - COMPLETE 4+ VIEW

[l-spine ap]
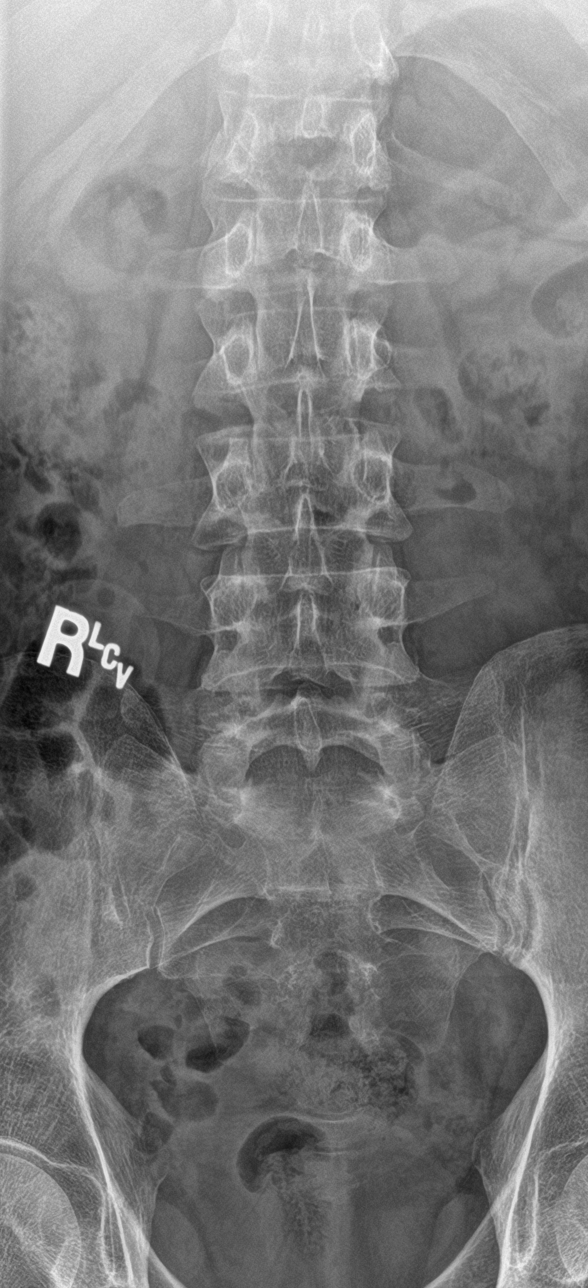

[l-spine obl (1 of 2)]
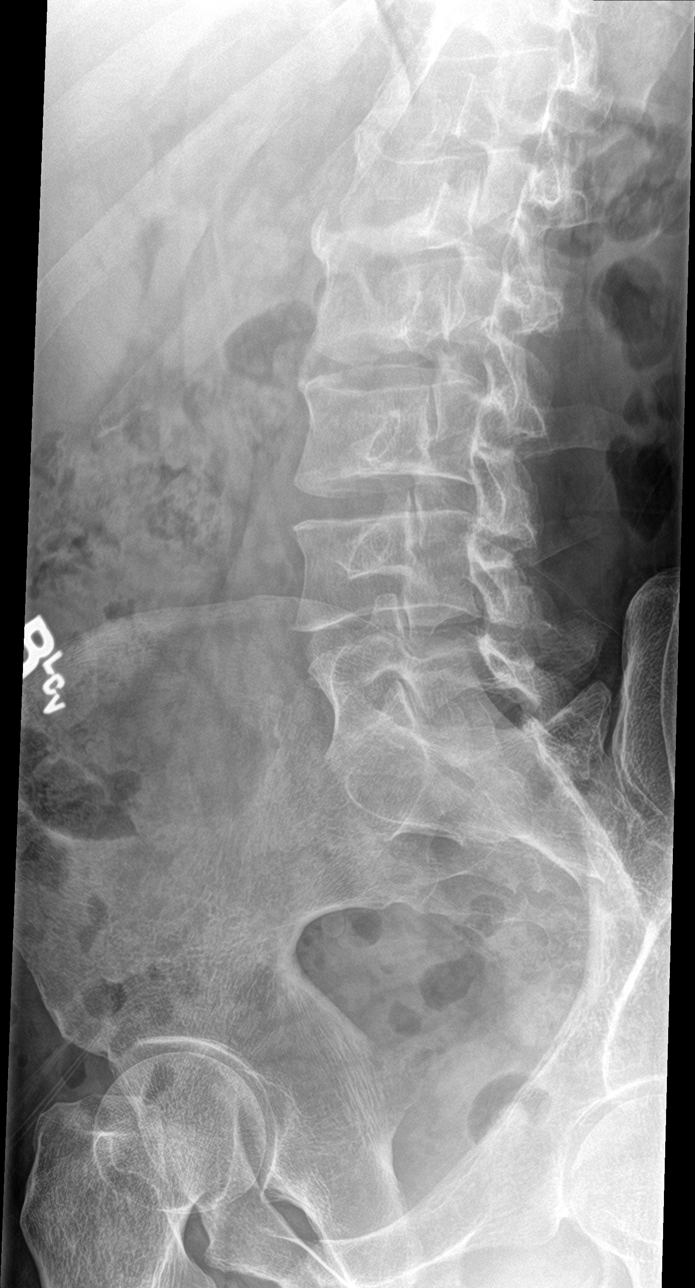

[l-spine obl (2 of 2)]
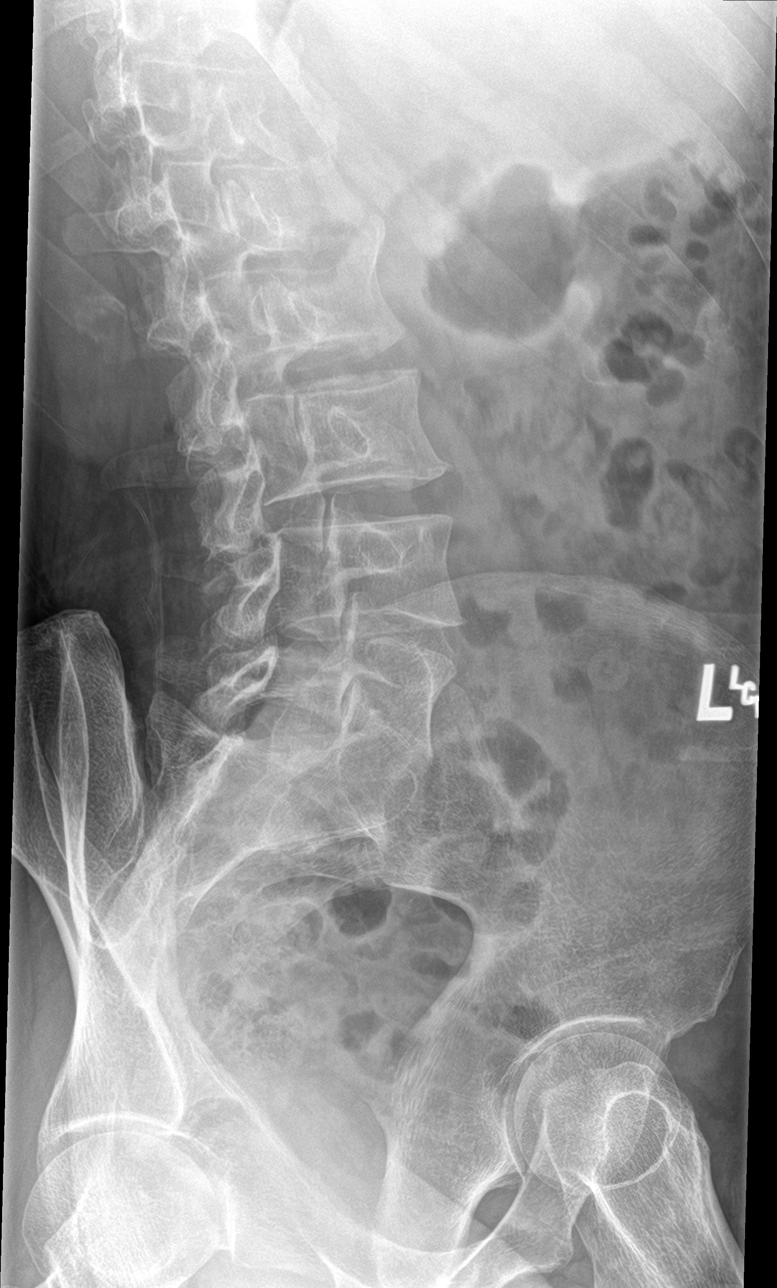

[l-spine lat]
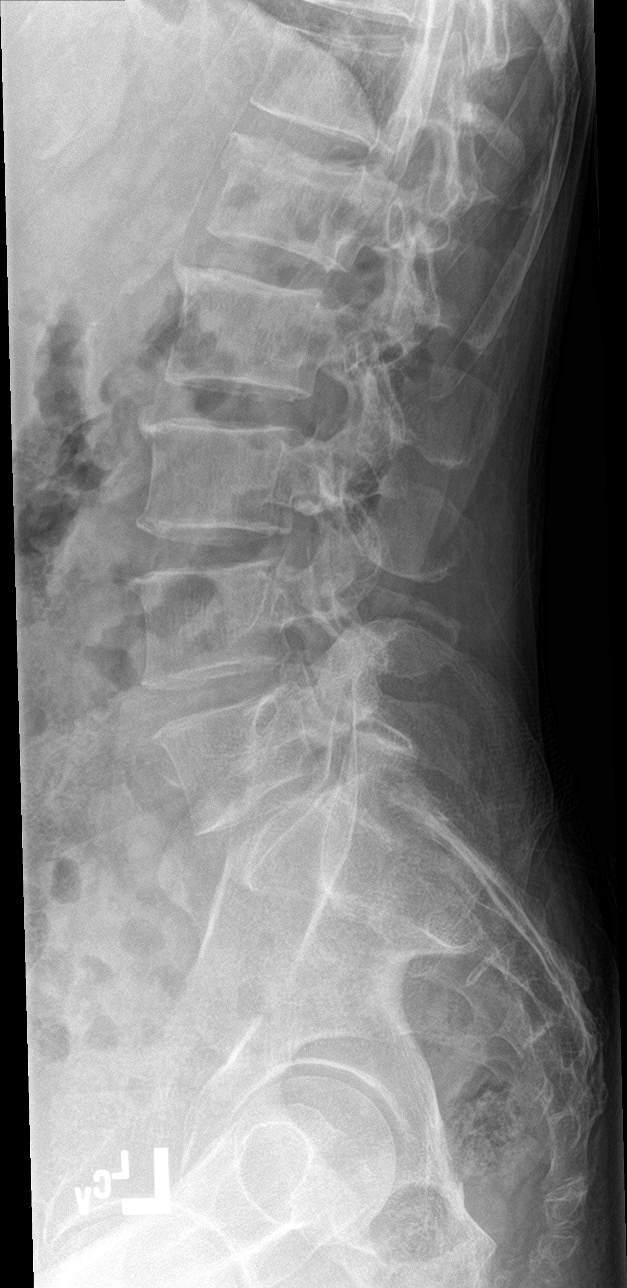

[l-spine spot]
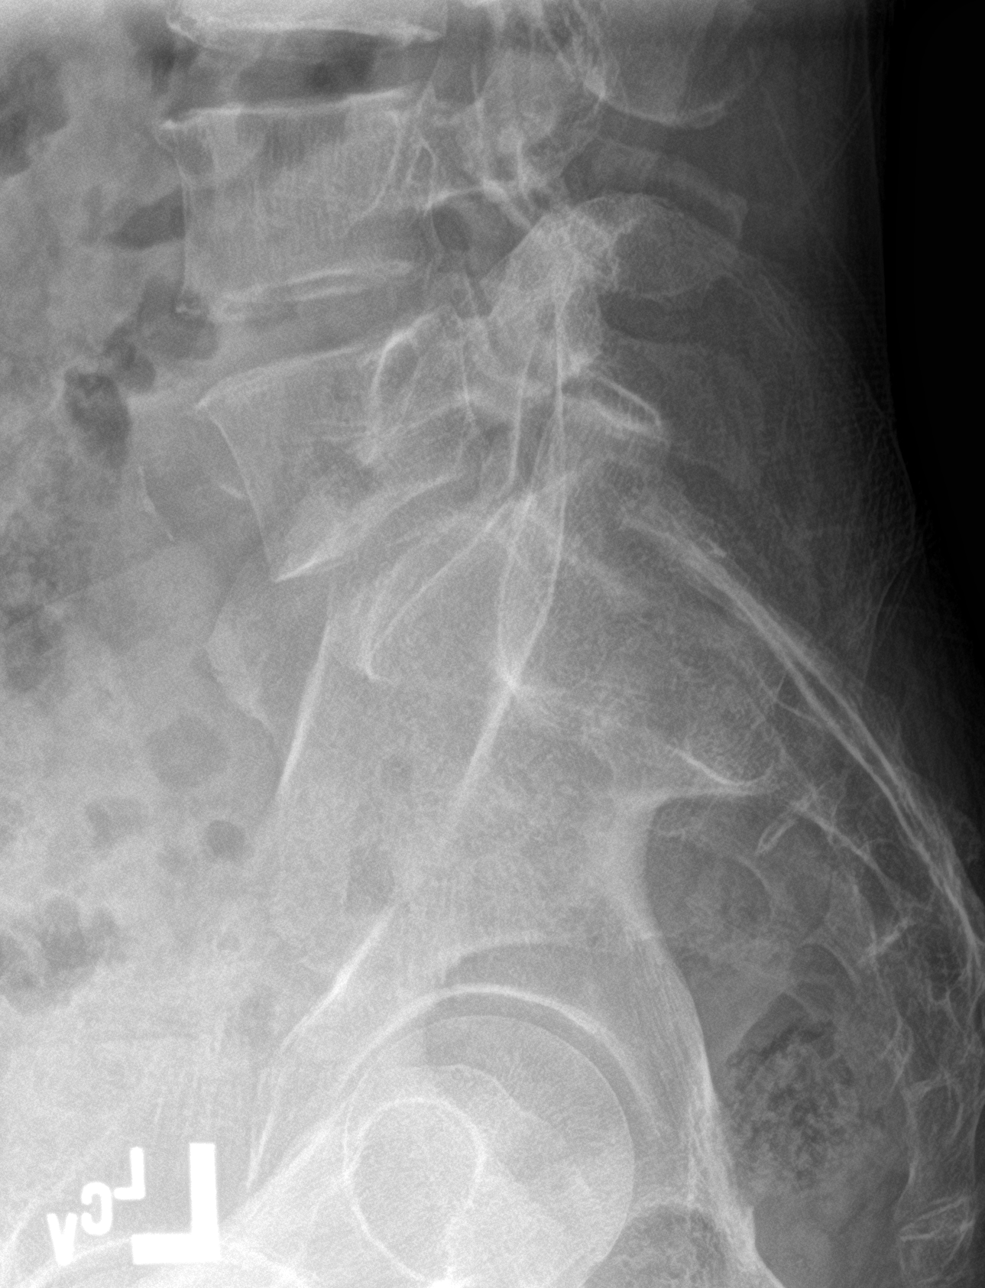

[5 of 5 positions shown; findings below may reference images not displayed]

FINDINGS: Subtle cortical irregularity at the anterior aspect of T12 vertebral
body with mild loss of height anteriorly suggesting mild compression
deformity, age indeterminate.

Cortical irregularity along the anterior margin of the L1 vertebral
body with linear sclerosis across the body below the superior
endplate suggesting mild compression deformity, age indeterminate.

No evident bony retropulsion. No evidence of posterior element
involvement. Alignment is preserved. Disc heights maintained
throughout.
IMPRESSION: Mild T12 and L1 compression deformities, age indeterminate.

## 2024-05-13 ENCOUNTER — Emergency Department
Admission: EM | Admit: 2024-05-13 | Discharge: 2024-05-14 | Disposition: A | Discharge status: Home / Self Care | Payer: Worker's Compensation | Attending: Emergency Medicine | Admitting: Emergency Medicine

## 2024-05-13 ENCOUNTER — Emergency Department: Payer: Worker's Compensation

## 2024-05-13 ENCOUNTER — Other Ambulatory Visit: Payer: Self-pay

## 2024-05-13 DIAGNOSIS — Y92410 Unspecified street and highway as the place of occurrence of the external cause: Secondary | ICD-10-CM | POA: Diagnosis not present

## 2024-05-13 DIAGNOSIS — R0789 Other chest pain: Secondary | ICD-10-CM | POA: Insufficient documentation

## 2024-05-13 DIAGNOSIS — M7989 Other specified soft tissue disorders: Secondary | ICD-10-CM | POA: Diagnosis present

## 2024-05-13 DIAGNOSIS — R2241 Localized swelling, mass and lump, right lower limb: Secondary | ICD-10-CM

## 2024-05-13 DIAGNOSIS — M79604 Pain in right leg: Secondary | ICD-10-CM | POA: Insufficient documentation

## 2024-05-13 DIAGNOSIS — M25511 Pain in right shoulder: Secondary | ICD-10-CM | POA: Diagnosis not present

## 2024-05-13 HISTORY — DX: Essential (primary) hypertension: I10

## 2024-05-13 HISTORY — DX: Pure hypercholesterolemia, unspecified: E78.00

## 2024-05-13 MED ORDER — METHOCARBAMOL 500 MG PO TABS
500.0000 mg | ORAL_TABLET | Freq: Once | ORAL | Status: AC
  Administered 2024-05-13: 500 mg via ORAL
  Filled 2024-05-13: qty 1

## 2024-05-13 MED ORDER — METHOCARBAMOL 500 MG PO TABS: 500.0000 mg | ORAL_TABLET | Freq: Four times a day (QID) | ORAL | 0 refills | Status: AC

## 2024-05-13 MED ORDER — KETOROLAC TROMETHAMINE 15 MG/ML IJ SOLN
15.0000 mg | Freq: Once | INTRAMUSCULAR | Status: AC
  Administered 2024-05-13: 15 mg via INTRAMUSCULAR
  Filled 2024-05-13: qty 1
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
## 2024-06-01 ENCOUNTER — Other Ambulatory Visit: Payer: Self-pay

## 2024-06-01 ENCOUNTER — Emergency Department: Payer: Self-pay

## 2024-06-01 ENCOUNTER — Emergency Department
Admission: EM | Admit: 2024-06-01 | Discharge: 2024-06-01 | Disposition: A | Discharge status: Home / Self Care | Payer: Worker's Compensation | Attending: Emergency Medicine | Admitting: Emergency Medicine

## 2024-06-01 DIAGNOSIS — W268XXA Contact with other sharp object(s), not elsewhere classified, initial encounter: Secondary | ICD-10-CM | POA: Diagnosis not present

## 2024-06-01 DIAGNOSIS — Y990 Civilian activity done for income or pay: Secondary | ICD-10-CM | POA: Diagnosis not present

## 2024-06-01 DIAGNOSIS — S6992XA Unspecified injury of left wrist, hand and finger(s), initial encounter: Secondary | ICD-10-CM | POA: Diagnosis present

## 2024-06-01 DIAGNOSIS — S68621A Partial traumatic transphalangeal amputation of left index finger, initial encounter: Secondary | ICD-10-CM | POA: Diagnosis not present

## 2024-06-01 DIAGNOSIS — I10 Essential (primary) hypertension: Secondary | ICD-10-CM | POA: Diagnosis not present

## 2024-06-01 DIAGNOSIS — Z23 Encounter for immunization: Secondary | ICD-10-CM | POA: Diagnosis not present

## 2024-06-01 MED ORDER — LIDOCAINE HCL (PF) 1 % IJ SOLN
5.0000 mL | Freq: Once | INTRAMUSCULAR | Status: AC
  Administered 2024-06-01: 5 mL
  Filled 2024-06-01: qty 5

## 2024-06-01 MED ORDER — MICROFIBRILLAR COLL HEMOSTAT EX POWD
1.0000 g | Freq: Once | CUTANEOUS | Status: AC
  Administered 2024-06-01: 1 g via TOPICAL
  Filled 2024-06-01: qty 1

## 2024-06-01 MED ORDER — OXYCODONE-ACETAMINOPHEN 5-325 MG PO TABS
1.0000 | ORAL_TABLET | Freq: Once | ORAL | Status: AC
  Administered 2024-06-01: 1 via ORAL
  Filled 2024-06-01: qty 1

## 2024-06-01 MED ORDER — CEPHALEXIN 500 MG PO CAPS: 500.0000 mg | ORAL_CAPSULE | Freq: Four times a day (QID) | ORAL | 0 refills | Status: AC

## 2024-06-01 MED ORDER — SILVER NITRATE-POT NITRATE 75-25 % EX MISC
1.0000 | Freq: Once | CUTANEOUS | Status: DC
  Filled 2024-06-01: qty 10

## 2024-06-01 MED ORDER — TRANEXAMIC ACID FOR EPISTAXIS
500.0000 mg | Freq: Once | TOPICAL | Status: DC
Start: 2024-06-01 — End: 2024-06-01
  Filled 2024-06-01: qty 10

## 2024-06-01 MED ORDER — CEPHALEXIN 500 MG PO CAPS
500.0000 mg | ORAL_CAPSULE | Freq: Once | ORAL | Status: AC
  Administered 2024-06-01: 500 mg via ORAL
  Filled 2024-06-01: qty 1

## 2024-06-01 MED ORDER — TETANUS-DIPHTH-ACELL PERTUSSIS 5-2-15.5 LF-MCG/0.5 IM SUSP
0.5000 mL | Freq: Once | INTRAMUSCULAR | Status: AC
  Administered 2024-06-01: 0.5 mL via INTRAMUSCULAR
  Filled 2024-06-01: qty 0.5

## 2024-06-01 MED ORDER — LIDOCAINE-EPINEPHRINE-TETRACAINE (LET) TOPICAL GEL
3.0000 mL | Freq: Once | TOPICAL | Status: AC
  Administered 2024-06-01: 3 mL via TOPICAL

## 2024-06-01 MED ORDER — OXYCODONE-ACETAMINOPHEN 5-325 MG PO TABS: 1.0000 | ORAL_TABLET | Freq: Three times a day (TID) | ORAL | 0 refills | Status: AC | PRN

## 2024-06-01 MED ORDER — IBUPROFEN 800 MG PO TABS
800.0000 mg | ORAL_TABLET | Freq: Three times a day (TID) | ORAL | 0 refills | Status: AC | PRN
Start: 2024-06-01 — End: ?

## 2024-06-01 NOTE — Discharge Instructions (Addendum)
 Usted fue atendido en el departamento de urgencias por una amputacin parcial del dedo ndice de la mano izquierda.  Hoy fue atendido en el Departamento de Urgencias por una amputacin parcial del dedo ndice de la mano izquierda. Mantenga la zona limpia, pero no la sumerja en agua. Deje el vendaje puesto durante 48 horas y luego cmbielo despus de lavarlo con agua y jabn neutro. Aplique un pequeo trozo de apsito no qualcomm y luego envuelva el dedo con gasa blanca y cinta adhesiva. Por favor, acuda a la consulta de ortopedia lo antes posible. Deber llamar a su consultorio para programar una cita. Tome los antibiticos (Keflex) segn lo prescrito.  Tome Motrin  (ibuprofeno) segn sea necesario para el dolor, como se indica en la caja.  Tambin le he enviado Percocet para que lo tome segn sea necesario. Use este medicamento si el ibuprofeno no controla su dolor. No debe trabajar, conducir, beber alcohol, subir escaleras ni a alturas, ni tomar decisiones con museum/gallery conservator est tomando este medicamento.  Por favor, comunquese con su mdico lo antes posible con respecto a la visita de urgencia de hoy.  Si va a trabajar, asegrese de cubrir el dedo con un apsito y usar un contractor.  Regrese a urgencias o llame a su mdico si nota algn signo de infeccin, como fiebre, aumento del dolor, enrojecimiento, pus u otros sntomas que le preocupen.   You were seen in the emergency department for a partial amputation of your left pointer finger.  You have been seen in the Emergency Department (ED) today for a partial amputation of your left pointer finger.  Please keep the area clean but do not submerge it in water.  Leave the dressing on for 48 hours, then redress it after washing with plain soap and water.  Apply a small piece of the non-adhesive dressing, then wrap the finger with white gauze and tape. Please follow-up with orthopedics outpatient as soon as possible.  You will need to  give their office a call to schedule an appointment.  Please take the antibiotics (Keflex) as prescribed.  Please take Motrin  (ibuprofen ) as needed for discomfort as written on the box.    I have also sent in as needed Percocet for you.  Please use this medication if ibuprofen  is not controlling your pain.  You may not work, drive, drink alcohol, climb on ladders or heights or make any legal binding decisions while taking this medication.  Please follow up with your doctor as soon as possible regarding today's emergent visit.   If you are going to work, please be sure the finger is covered with a dressing as well as a protective glove.  Return to the ED or call your doctor if you notice any signs of infection such as fever, increased pain, increased redness, pus, or other symptoms that concern you.

## 2024-06-01 NOTE — ED Provider Notes (Addendum)
 [facility] Provider Note    None    (approximate)   History   Laceration   HPI  Philip George is a 63 y.o. male  with a past medical history of hypertension, dysrhythmia presents to the emergency department with injury to the left pointer/index finger today.  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  Has not taken any medication for pain.  Denies numbness or tingling of his finger.  Injury happened around 1 PM today.  Patient is not on any blood thinners.  Unsure of last tetanus shot.  Virtual Spanish interpreter used for this visit.  Physical Exam   Triage Vital Signs: ED Triage Vitals  Encounter Vitals Group     BP 06/01/24 1336 (!) 172/84     Girls Systolic BP Percentile --      Girls Diastolic BP Percentile --      Boys Systolic BP Percentile --      Boys Diastolic BP Percentile --      Pulse Rate 06/01/24 1336 72     Resp 06/01/24 1336 16     Temp 06/01/24 1338 98.2 F (36.8 C)     Temp src --      SpO2 06/01/24 1336 100 %     Weight --      Height --      Head Circumference --      Peak Flow --      Pain Score 06/01/24 1336 4     Pain Loc --      Pain Education --      Exclude from Growth Chart --     Most recent vital signs: Vitals:   06/01/24 1338 06/01/24 1906  BP:  (!) 145/72  Pulse:  74  Resp:  16  Temp: 98.2 F (36.8 C)   SpO2:  98%    General: Awake, in no acute distress.  Head: Normocephalic, atraumatic. CV: Good peripheral perfusion. Radial pulses 2+ b/l.  Respiratory:Normal respiratory effort.  No respiratory distress.  GI: Soft, non-distended. MSK: Normal ROM and 5/5 strength in all b/l finger joints. Patient is able to flex and extend left pointer finger DIP joint for what remains of it. Skin:Warm, dry, partial amputation to left index finger just distal to the DIP joint, currently bleeding. Neurological: A&Ox4 to person,  place, time, and situation. Sensation intact and equal to b/l fingers. Strength symmetric.      ED Results / Procedures / Treatments   Labs (all labs ordered are listed, but only abnormal results are displayed) Labs Reviewed - No data to display   EKG     RADIOLOGY X ray left index finger  FINDINGS:   BONES AND JOINTS: Status post traumatic amputation of most of the second distal phalanx. No joint dislocation.   SOFT TISSUES: Status post traumatic amputation of surrounding soft tissues.   IMPRESSION: 1. Traumatic amputation involving most of the second distal phalanx and adjacent soft tissues.   PROCEDURES:  Critical Care performed: No   Wound repair  Date/Time: 06/01/2024 6:18 PM  Performed by: Sheron Salm, PA-C Authorized by: Sheron Salm, PA-C  Consent: Verbal consent obtained Risks and benefits: risks, benefits and alternatives were discussed Consent given by: patient Patient understanding: patient states understanding of the procedure being performed Imaging studies: imaging studies available Patient identity confirmed: verbally with patient Time out: Immediately prior to procedure a time out  was called to verify the correct patient, procedure, equipment, support staff and site/side marked as required. Preparation: Patient was prepped and draped in the usual sterile fashion. Local anesthesia used: yes Anesthesia: digital block  Anesthesia: Local anesthesia used: yes Local Anesthetic: lidocaine  1% without epinephrine Anesthetic total: 5 mL  Sedation: Patient sedated: no  Patient tolerance: patient tolerated the procedure well with no immediate complications Comments: Bandage removed after digital block achieved.  Finger tourniquet applied. Cleansed around the wound with Betadine followed by infiltration with 100 mL of sterile saline.  1 g of Avitene applied and left on for 5 minutes.  After removed and tourniquet taken off, 1 artery still  bleeding, therefore 1 application of silver nitrate applied after reapplying tourniquet.  Tourniquet removed after hemostasis achieved. Wound was then dressed with Surgicel and Kerlix followed by tape.      MEDICATIONS ORDERED IN ED: Medications  silver nitrate applicators applicator 1 Application (has no administration in time range)  Tdap (ADACEL) injection 0.5 mL (0.5 mLs Intramuscular Given 06/01/24 1559)  lidocaine -EPINEPHrine-tetracaine (LET) topical gel (3 mLs Topical Given 06/01/24 1550)  oxyCODONE -acetaminophen  (PERCOCET/ROXICET) 5-325 MG per tablet 1 tablet (1 tablet Oral Given 06/01/24 1558)  cephALEXin (KEFLEX) capsule 500 mg (500 mg Oral Given 06/01/24 1703)  microfibrillar collagen (AVITENE FLOUR) powder 1 g (1 g Topical Given by Other 06/01/24 1704)  lidocaine  (PF) (XYLOCAINE ) 1 % injection 5 mL (5 mLs Other Given by Other 06/01/24 1703)     IMPRESSION / MDM / ASSESSMENT AND PLAN / ED COURSE  I reviewed the triage vital signs and the nursing notes.                              Differential diagnosis includes, but is not limited to, partial amputation of left index finger distal phalanx, distal tuft fracture, nerve or tendon injury, finger laceration  Patient's presentation is most consistent with acute complicated illness / injury requiring diagnostic workup.   Clinical Course as of 06/01/24 1915  Tue Jun 01, 2024  1635 Late entry. LET placed on distal end of left index finger for bleeding control.  1 dose of oxycodone  given for pain.  Tetanus updated.  X-ray of left hand ordered.  Will contact orthopedics as soon as x-ray is read by radiologist.  Patient does not have any numbness or tingling of his finger at this time, just states it is painful. Bleed has acutely stopped but will place Ativene to reduce chance of bleeding more when I go to dress the wound.  [SD]  1644 Left index finger IMPRESSION: 1. Traumatic amputation involving most of the second distal phalanx  and adjacent soft tissues. [SD]  1644 Patient in minimal pain after admin of Percocet. [SD]  1813 Discussed patient with Dr. Ezra, hand surgeon and Ortho on-call.  Recommended starting on oral antibiotics and cleansing the area thoroughly followed by Surgicel and Kerlix dressing, and will follow-up outpatient in office. [SD]  1813 Please see procedure note for full details regarding wound care of finger. Avitene applied to stop the bleeding, but unsuccessful, so had to use one application of silver nitrate, then finger was dressed with Surgicel and Kerlex.  1 dose of Keflex given here and will send prescription for this.  Discussed ibuprofen  use and Percocet use if needed for pain.  PDMP reviewed prior to prescribing Percocet.  Patient will follow-up outpatient with Dr. Ezra.  Wound Care instructions cussed with the patient.  He should follow up with his PCP as well. [SD]    Clinical Course User Index [SD] Sheron Salm, PA-C    The patient may return to the emergency department for any new, worsening, or concerning symptoms. Patient was given the opportunity to ask questions; all questions were answered. Emergency department return precautions were discussed with the patient.  Patient is in agreement to the treatment plan.  Patient is stable for discharge.   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     Sheron Salm, PA-C 06/01/24  1824    [address], [place], PA-C 06/01/24 1915    Dorothyann Drivers, MD 06/01/24 2236

## 2024-06-01 NOTE — ED Triage Notes (Signed)
 Pt had laceration at work. Left finger, pt says it went to the bone. Tip of finger attached on on side yet.
